# Patient Record
Sex: Female | Born: 1940 | Race: White | Hispanic: No | Smoking: Former smoker
Health system: Southern US, Community
[De-identification: ages and names within clinical notes are randomized; demographics above are authoritative.]

## PROBLEM LIST (undated history)

## (undated) DIAGNOSIS — C801 Malignant (primary) neoplasm, unspecified: Secondary | ICD-10-CM

## (undated) DIAGNOSIS — R519 Headache, unspecified: Secondary | ICD-10-CM

## (undated) DIAGNOSIS — M1990 Unspecified osteoarthritis, unspecified site: Secondary | ICD-10-CM

## (undated) DIAGNOSIS — R51 Headache: Secondary | ICD-10-CM

## (undated) DIAGNOSIS — Z87442 Personal history of urinary calculi: Secondary | ICD-10-CM

## (undated) DIAGNOSIS — M79672 Pain in left foot: Secondary | ICD-10-CM

## (undated) DIAGNOSIS — M79671 Pain in right foot: Secondary | ICD-10-CM

## (undated) HISTORY — PX: CRYOABLATION: SHX1415

## (undated) HISTORY — PX: OTHER SURGICAL HISTORY: SHX169

## (undated) HISTORY — PX: TONSILLECTOMY: SUR1361

## (undated) HISTORY — PX: DILATION AND CURETTAGE OF UTERUS: SHX78

## (undated) HISTORY — PX: JOINT REPLACEMENT: SHX530

---

## 1987-06-06 HISTORY — PX: CHOLECYSTECTOMY: SHX55

## 1989-06-05 DIAGNOSIS — Z87442 Personal history of urinary calculi: Secondary | ICD-10-CM

## 1989-06-05 HISTORY — PX: BREAST SURGERY: SHX581

## 1989-06-05 HISTORY — DX: Personal history of urinary calculi: Z87.442

## 1989-06-05 HISTORY — PX: EXTRACORPOREAL SHOCK WAVE LITHOTRIPSY: SHX1557

## 1997-06-05 DIAGNOSIS — C801 Malignant (primary) neoplasm, unspecified: Secondary | ICD-10-CM

## 1997-06-05 HISTORY — PX: OTHER SURGICAL HISTORY: SHX169

## 1997-06-05 HISTORY — PX: BREAST BIOPSY: SHX20

## 1997-06-05 HISTORY — DX: Malignant (primary) neoplasm, unspecified: C80.1

## 2002-09-30 ENCOUNTER — Ambulatory Visit (HOSPITAL_COMMUNITY): Admission: RE | Admit: 2002-09-30 | Discharge: 2002-09-30 | Payer: Self-pay | Admitting: Internal Medicine

## 2002-09-30 ENCOUNTER — Encounter: Payer: Self-pay | Admitting: Internal Medicine

## 2003-10-13 ENCOUNTER — Ambulatory Visit (HOSPITAL_COMMUNITY): Admission: RE | Admit: 2003-10-13 | Discharge: 2003-10-13 | Payer: Self-pay

## 2004-10-18 ENCOUNTER — Ambulatory Visit (HOSPITAL_COMMUNITY): Admission: RE | Admit: 2004-10-18 | Discharge: 2004-10-18 | Payer: Self-pay | Admitting: Internal Medicine

## 2005-10-24 ENCOUNTER — Ambulatory Visit (HOSPITAL_COMMUNITY): Admission: RE | Admit: 2005-10-24 | Discharge: 2005-10-24 | Payer: Self-pay | Admitting: Internal Medicine

## 2006-10-30 ENCOUNTER — Ambulatory Visit (HOSPITAL_COMMUNITY): Admission: RE | Admit: 2006-10-30 | Discharge: 2006-10-30 | Payer: Self-pay | Admitting: Internal Medicine

## 2006-11-12 ENCOUNTER — Encounter: Admission: RE | Admit: 2006-11-12 | Discharge: 2006-11-12 | Payer: Self-pay | Admitting: Family Medicine

## 2007-11-19 ENCOUNTER — Ambulatory Visit (HOSPITAL_COMMUNITY): Admission: RE | Admit: 2007-11-19 | Discharge: 2007-11-19 | Payer: Self-pay | Admitting: Internal Medicine

## 2008-11-24 ENCOUNTER — Ambulatory Visit (HOSPITAL_COMMUNITY): Admission: RE | Admit: 2008-11-24 | Discharge: 2008-11-24 | Payer: Self-pay | Admitting: Internal Medicine

## 2009-11-30 ENCOUNTER — Ambulatory Visit (HOSPITAL_COMMUNITY): Admission: RE | Admit: 2009-11-30 | Discharge: 2009-11-30 | Payer: Self-pay | Admitting: Internal Medicine

## 2009-12-10 ENCOUNTER — Encounter: Admission: RE | Admit: 2009-12-10 | Discharge: 2009-12-10 | Payer: Self-pay | Admitting: Internal Medicine

## 2010-06-26 ENCOUNTER — Encounter: Payer: Self-pay | Admitting: Family Medicine

## 2010-11-07 ENCOUNTER — Other Ambulatory Visit (HOSPITAL_COMMUNITY): Payer: Self-pay | Admitting: Internal Medicine

## 2010-11-07 DIAGNOSIS — Z1231 Encounter for screening mammogram for malignant neoplasm of breast: Secondary | ICD-10-CM

## 2010-12-08 ENCOUNTER — Ambulatory Visit (HOSPITAL_COMMUNITY)
Admission: RE | Admit: 2010-12-08 | Discharge: 2010-12-08 | Disposition: A | Discharge status: Home / Self Care | Payer: PRIVATE HEALTH INSURANCE | Source: Ambulatory Visit | Attending: Internal Medicine | Admitting: Internal Medicine

## 2010-12-08 DIAGNOSIS — Z1231 Encounter for screening mammogram for malignant neoplasm of breast: Secondary | ICD-10-CM

## 2011-01-04 ENCOUNTER — Other Ambulatory Visit (HOSPITAL_COMMUNITY): Payer: Self-pay | Admitting: Internal Medicine

## 2011-01-04 DIAGNOSIS — Z1231 Encounter for screening mammogram for malignant neoplasm of breast: Secondary | ICD-10-CM

## 2011-12-11 ENCOUNTER — Ambulatory Visit (HOSPITAL_COMMUNITY): Payer: PRIVATE HEALTH INSURANCE

## 2012-01-16 ENCOUNTER — Ambulatory Visit (HOSPITAL_COMMUNITY)
Admission: RE | Admit: 2012-01-16 | Discharge: 2012-01-16 | Disposition: A | Discharge status: Home / Self Care | Payer: PRIVATE HEALTH INSURANCE | Source: Ambulatory Visit | Attending: Internal Medicine | Admitting: Internal Medicine

## 2012-01-16 DIAGNOSIS — Z1231 Encounter for screening mammogram for malignant neoplasm of breast: Secondary | ICD-10-CM

## 2012-12-24 ENCOUNTER — Other Ambulatory Visit (HOSPITAL_COMMUNITY): Payer: Self-pay | Admitting: Family Medicine

## 2012-12-24 DIAGNOSIS — Z1231 Encounter for screening mammogram for malignant neoplasm of breast: Secondary | ICD-10-CM

## 2013-01-21 ENCOUNTER — Ambulatory Visit (HOSPITAL_COMMUNITY)
Admission: RE | Admit: 2013-01-21 | Discharge: 2013-01-21 | Disposition: A | Discharge status: Home / Self Care | Payer: PRIVATE HEALTH INSURANCE | Source: Ambulatory Visit | Attending: Family Medicine | Admitting: Family Medicine

## 2013-01-21 ENCOUNTER — Other Ambulatory Visit (HOSPITAL_COMMUNITY): Payer: Self-pay | Admitting: Family Medicine

## 2013-01-21 DIAGNOSIS — Z1231 Encounter for screening mammogram for malignant neoplasm of breast: Secondary | ICD-10-CM

## 2014-01-20 ENCOUNTER — Other Ambulatory Visit (HOSPITAL_COMMUNITY): Payer: Self-pay | Admitting: Family Medicine

## 2014-01-20 DIAGNOSIS — Z1231 Encounter for screening mammogram for malignant neoplasm of breast: Secondary | ICD-10-CM

## 2014-01-27 ENCOUNTER — Other Ambulatory Visit (HOSPITAL_COMMUNITY): Payer: Self-pay | Admitting: Family Medicine

## 2014-01-27 ENCOUNTER — Ambulatory Visit (HOSPITAL_COMMUNITY)
Admission: RE | Admit: 2014-01-27 | Discharge: 2014-01-27 | Disposition: A | Discharge status: Home / Self Care | Payer: PRIVATE HEALTH INSURANCE | Source: Ambulatory Visit | Attending: Family Medicine | Admitting: Family Medicine

## 2014-01-27 ENCOUNTER — Ambulatory Visit (HOSPITAL_COMMUNITY): Payer: PRIVATE HEALTH INSURANCE

## 2014-01-27 DIAGNOSIS — Z1231 Encounter for screening mammogram for malignant neoplasm of breast: Secondary | ICD-10-CM | POA: Diagnosis present

## 2015-01-14 ENCOUNTER — Other Ambulatory Visit (HOSPITAL_COMMUNITY): Payer: Self-pay | Admitting: Obstetrics and Gynecology

## 2015-01-14 DIAGNOSIS — Z1231 Encounter for screening mammogram for malignant neoplasm of breast: Secondary | ICD-10-CM

## 2015-02-02 ENCOUNTER — Ambulatory Visit (HOSPITAL_COMMUNITY)
Admission: RE | Admit: 2015-02-02 | Discharge: 2015-02-02 | Disposition: A | Discharge status: Home / Self Care | Payer: PRIVATE HEALTH INSURANCE | Source: Ambulatory Visit | Attending: Obstetrics and Gynecology | Admitting: Obstetrics and Gynecology

## 2015-02-02 DIAGNOSIS — Z1231 Encounter for screening mammogram for malignant neoplasm of breast: Secondary | ICD-10-CM | POA: Diagnosis present

## 2016-01-20 ENCOUNTER — Other Ambulatory Visit: Payer: Self-pay | Admitting: Internal Medicine

## 2016-01-20 DIAGNOSIS — Z1231 Encounter for screening mammogram for malignant neoplasm of breast: Secondary | ICD-10-CM

## 2016-02-04 ENCOUNTER — Ambulatory Visit
Admission: RE | Admit: 2016-02-04 | Discharge: 2016-02-04 | Disposition: A | Discharge status: Home / Self Care | Payer: Medicare HMO | Source: Ambulatory Visit | Attending: Internal Medicine | Admitting: Internal Medicine

## 2016-02-04 DIAGNOSIS — Z1231 Encounter for screening mammogram for malignant neoplasm of breast: Secondary | ICD-10-CM

## 2016-07-21 ENCOUNTER — Other Ambulatory Visit (HOSPITAL_COMMUNITY): Payer: Self-pay | Admitting: *Deleted

## 2016-07-21 NOTE — Progress Notes (Signed)
Medical clearnce dr Jeralene Huff on chart for 07-28-16 surgery

## 2016-07-21 NOTE — Patient Instructions (Signed)
Lisa Hood  07/21/2016   Your procedure is scheduled on: 08-01-16  Report to [facility] Main  Entrance take [facility]  elevators to 3rd floor to  [place] at 1120 AM.  Call this number if you have problems the morning of surgery [phone]   Remember: ONLY 1 PERSON MAY GO WITH YOU TO SHORT STAY TO GET  READY MORNING OF [place].  Do not eat food :After Midnight Monday night, clear liquids from midnight Monday night until 820 am day of surgery, then nothing by mouth after 820 am day of surgery.     Take these medicines the morning of surgery with A SIP OF WATER: none              You may not have any metal on your body including hair pins and              piercings  Do not wear jewelry, make-up, lotions, powders or perfumes, deodorant             Do not wear nail polish.  Do not shave  48 hours prior to surgery.              Men may shave face and neck.   Do not bring valuables to the hospital. [place].  Contacts, dentures or bridgework may not be worn into surgery.  Leave suitcase in the car. After surgery it may be brought to your room.                   Please read over the following fact sheets you were given: _____________________________________________________________________                CLEAR LIQUID DIET   Foods Allowed                                                                     Foods Excluded  Coffee and tea, regular and decaf                             liquids that you cannot  Plain Jell-O in any flavor                                             see through such as: Fruit ices (not with fruit pulp)                                     milk, soups, orange juice  Iced Popsicles                                    All solid food Carbonated beverages, regular and diet  Cranberry, grape and apple juices Sports drinks like  Gatorade Lightly seasoned clear broth or consume(fat free) Sugar, honey syrup  Sample Menu Breakfast                                Lunch                                     Supper Cranberry juice                    Beef broth                            Chicken broth Jell-O                                     Grape juice                           Apple juice Coffee or tea                        Jell-O                                      Popsicle                                                Coffee or tea                        Coffee or tea  _____________________________________________________________________  [facility] - Preparing for Surgery Before surgery, you can play an important role.  Because skin is not sterile, your skin needs to be as free of germs as possible.  You can reduce the number of germs on your skin by washing with CHG (chlorahexidine gluconate) soap before surgery.  CHG is an antiseptic cleaner which kills germs and bonds with the skin to continue killing germs even after washing. Please DO NOT use if you have an allergy to CHG or antibacterial soaps.  If your skin becomes reddened/irritated stop using the CHG and inform your nurse when you arrive at Short Stay. Do not shave (including legs and underarms) for at least 48 hours prior to the first CHG shower.  You may shave your face/neck. Please follow these instructions carefully:  1.  Shower with CHG Soap the night before surgery and the  morning of Surgery.  2.  If you choose to wash your hair, wash your hair first as usual with your  normal  shampoo.  3.  After you shampoo, rinse your hair and body thoroughly to remove the  shampoo.                           4.  Use CHG as you would any other liquid soap.  You can apply chg directly  to the skin and wash  Gently with a scrungie or clean washcloth.  5.  Apply the CHG Soap to your body ONLY FROM THE NECK DOWN.   Do not use on face/ open                            Wound or open sores. Avoid contact with eyes, ears mouth and genitals (private parts).                       Wash face,  Genitals (private parts) with your normal soap.             6.  Wash thoroughly, paying special attention to the area where your surgery  will be performed.  7.  Thoroughly rinse your body with warm water from the neck down.  8.  DO NOT shower/wash with your normal soap after using and rinsing off  the CHG Soap.                9.  Pat yourself dry with a clean towel.            10.  Wear clean pajamas.            11.  Place clean sheets on your bed the night of your first shower and do not  sleep with pets. Day of Surgery : Do not apply any lotions/deodorants the morning of surgery.  Please wear clean clothes to the hospital/surgery center.  FAILURE TO FOLLOW THESE INSTRUCTIONS MAY RESULT IN THE CANCELLATION OF YOUR SURGERY PATIENT SIGNATURE_________________________________  NURSE SIGNATURE__________________________________  ________________________________________________________________________   Adam Phenix  An incentive spirometer is a tool that can help keep your lungs clear and active. This tool measures how well you are filling your lungs with each breath. Taking long deep breaths may help reverse or decrease the chance of developing breathing (pulmonary) problems (especially infection) following:  A long period of time when you are unable to move or be active. BEFORE THE PROCEDURE   If the spirometer includes an indicator to show your best effort, your nurse or respiratory therapist will set it to a desired goal.  If possible, sit up straight or lean slightly forward. Try not to slouch.  Hold the incentive spirometer in an upright position. INSTRUCTIONS FOR USE  1. Sit on the edge of your bed if possible, or sit up as far as you can in bed or on a chair. 2. Hold the incentive spirometer in an upright position. 3. Breathe out  normally. 4. Place the mouthpiece in your mouth and seal your lips tightly around it. 5. Breathe in slowly and as deeply as possible, raising the piston or the ball toward the top of the column. 6. Hold your breath for 3-5 seconds or for as long as possible. Allow the piston or ball to fall to the bottom of the column. 7. Remove the mouthpiece from your mouth and breathe out normally. 8. Rest for a few seconds and repeat Steps 1 through 7 at least 10 times every 1-2 hours when you are awake. Take your time and take a few normal breaths between deep breaths. 9. The spirometer may include an indicator to show your best effort. Use the indicator as a goal to work toward during each repetition. 10. After each set of 10 deep breaths, practice coughing to be sure your lungs are clear. If you have an incision (the cut made at the time of  surgery), support your incision when coughing by placing a pillow or rolled up towels firmly against it. Once you are able to get out of bed, walk around indoors and cough well. You may stop using the incentive spirometer when instructed by your caregiver.  RISKS AND COMPLICATIONS  Take your time so you do not get dizzy or light-headed.  If you are in pain, you may need to take or ask for pain medication before doing incentive spirometry. It is harder to take a deep breath if you are having pain. AFTER USE  Rest and breathe slowly and easily.  It can be helpful to keep track of a log of your progress. Your caregiver can provide you with a simple table to help with this. If you are using the spirometer at home, follow these instructions: [place] IF:   You are having difficultly using the spirometer.  You have trouble using the spirometer as often as instructed.  Your pain medication is not giving enough relief while using the spirometer.  You develop fever of 100.5 F (38.1 C) or higher. SEEK IMMEDIATE MEDICAL CARE IF:   You cough up bloody sputum  that had not been present before.  You develop fever of 102 F (38.9 C) or greater.  You develop worsening pain at or near the incision site. MAKE SURE YOU:   Understand these instructions.  Will watch your condition.  Will get help right away if you are not doing well or get worse. Document Released: 10/02/2006 Document Revised: 08/14/2011 Document Reviewed: 12/03/2006 Exit[employer] Patient Information 2014 [employer], [place].   ________________________________________________________________________  WHAT IS A BLOOD TRANSFUSION? Blood Transfusion Information  A transfusion is the replacement of blood or some of its parts. Blood is made up of multiple cells which provide different functions.  Red blood cells carry oxygen and are used for blood loss replacement.  White blood cells fight against infection.  Platelets control bleeding.  Plasma helps clot blood.  Other blood products are available for specialized needs, such as hemophilia or other clotting disorders. BEFORE THE TRANSFUSION  Who gives blood for transfusions?   Healthy volunteers who are fully evaluated to make sure their blood is safe. This is blood bank blood. Transfusion therapy is the safest it has ever been in the practice of medicine. Before blood is taken from a donor, a complete history is taken to make sure that person has no history of diseases nor engages in risky social behavior (examples are intravenous drug use or sexual activity with multiple partners). The donor's travel history is screened to minimize risk of transmitting infections, such as malaria. The donated blood is tested for signs of infectious diseases, such as HIV and hepatitis. The blood is then tested to be sure it is compatible with you in order to minimize the chance of a transfusion reaction. If you or a relative donates blood, this is often done in anticipation of surgery and is not appropriate for emergency situations. It takes many days to  process the donated blood. RISKS AND COMPLICATIONS Although transfusion therapy is very safe and saves many lives, the main dangers of transfusion include:   Getting an infectious disease.  Developing a transfusion reaction. This is an allergic reaction to something in the blood you were given. Every precaution is taken to prevent this. The decision to have a blood transfusion has been considered carefully by your caregiver before blood is given. Blood is not given unless the benefits outweigh the risks. AFTER THE  TRANSFUSION  Right after receiving a blood transfusion, you will usually feel much better and more energetic. This is especially true if your red blood cells have gotten low (anemic). The transfusion raises the level of the red blood cells which carry oxygen, and this usually causes an energy increase.  The nurse administering the transfusion will monitor you carefully for complications. HOME CARE INSTRUCTIONS  No special instructions are needed after a transfusion. You may find your energy is better. Speak with your caregiver about any limitations on activity for underlying diseases you may have. SEEK MEDICAL CARE IF:   Your condition is not improving after your transfusion.  You develop redness or irritation at the intravenous (IV) site. SEEK IMMEDIATE MEDICAL CARE IF:  Any of the following symptoms occur over the next 12 hours:  Shaking chills.  You have a temperature by mouth above 102 F (38.9 C), not controlled by medicine.  Chest, back, or muscle pain.  People around you feel you are not acting correctly or are confused.  Shortness of breath or difficulty breathing.  Dizziness and fainting.  You get a rash or develop hives.  You have a decrease in urine output.  Your urine turns a dark color or changes to pink, red, or brown. Any of the following symptoms occur over the next 10 days:  You have a temperature by mouth above 102 F (38.9 C), not controlled by  medicine.  Shortness of breath.  Weakness after normal activity.  The white part of the eye turns yellow (jaundice).  You have a decrease in the amount of urine or are urinating less often.  Your urine turns a dark color or changes to pink, red, or brown. Document Released: 05/19/2000 Document Revised: 08/14/2011 Document Reviewed: 01/06/2008 [facility] Patient Information 2014 [place], [place].  _______________________________________________________________________

## 2016-07-25 ENCOUNTER — Encounter (HOSPITAL_COMMUNITY): Payer: Self-pay

## 2016-07-25 ENCOUNTER — Encounter (HOSPITAL_COMMUNITY)
Admission: RE | Admit: 2016-07-25 | Discharge: 2016-07-25 | Disposition: A | Discharge status: Home / Self Care | Payer: Medicare HMO | Source: Ambulatory Visit | Attending: Orthopedic Surgery | Admitting: Orthopedic Surgery

## 2016-07-25 ENCOUNTER — Encounter (INDEPENDENT_AMBULATORY_CARE_PROVIDER_SITE_OTHER): Payer: Self-pay

## 2016-07-25 DIAGNOSIS — M1612 Unilateral primary osteoarthritis, left hip: Secondary | ICD-10-CM | POA: Diagnosis not present

## 2016-07-25 DIAGNOSIS — Z01812 Encounter for preprocedural laboratory examination: Secondary | ICD-10-CM | POA: Insufficient documentation

## 2016-07-25 HISTORY — DX: Headache: R51

## 2016-07-25 HISTORY — DX: Unspecified osteoarthritis, unspecified site: M19.90

## 2016-07-25 HISTORY — DX: Headache, unspecified: R51.9

## 2016-07-25 HISTORY — DX: Personal history of urinary calculi: Z87.442

## 2016-07-25 HISTORY — DX: Pain in right foot: M79.671

## 2016-07-25 HISTORY — DX: Pain in left foot: M79.672

## 2016-07-25 HISTORY — DX: Malignant (primary) neoplasm, unspecified: C80.1

## 2016-07-25 LAB — CBC
HCT: 40.5 % (ref 36.0–46.0)
HEMOGLOBIN: 13.8 g/dL (ref 12.0–15.0)
MCH: 32.2 pg (ref 26.0–34.0)
MCHC: 34.1 g/dL (ref 30.0–36.0)
MCV: 94.4 fL (ref 78.0–100.0)
PLATELETS: 289 10*3/uL (ref 150–400)
RBC: 4.29 MIL/uL (ref 3.87–5.11)
RDW: 13.6 % (ref 11.5–15.5)
WBC: 8.3 10*3/uL (ref 4.0–10.5)

## 2016-07-25 LAB — SURGICAL PCR SCREEN
MRSA, PCR: NEGATIVE
Staphylococcus aureus: NEGATIVE

## 2016-07-25 LAB — ABO/RH: ABO/RH(D): A POS

## 2016-07-25 NOTE — Progress Notes (Signed)
EKG 2-113-18 DR [facility] ON CHART

## 2016-07-27 NOTE — H&P (Signed)
TOTAL HIP ADMISSION H&P  Patient is admitted for left total hip arthroplasty, anterior approach.  Subjective:  Chief Complaint:    Left hip primary OA / pain  HPI: Lisa Hood, 76 y.o. female, has a history of pain and functional disability in the left hip(s) due to arthritis and patient has failed non-surgical conservative treatments for greater than 12 weeks to include NSAID's and/or analgesics, use of assistive devices and activity modification.  Onset of symptoms was gradual starting 2+ years ago with gradually worsening course since that time.The patient noted no past surgery on the left hip(s).  Patient currently rates pain in the left hip at 9 out of 10 with activity. Patient has night pain, worsening of pain with activity and weight bearing, trendelenberg gait, pain that interfers with activities of daily living and pain with passive range of motion. Patient has evidence of periarticular osteophytes and joint space narrowing by imaging studies. This condition presents safety issues increasing the risk of falls.  There is no current active infection.  Risks, benefits and expectations were discussed with the patient.  Risks including but not limited to the risk of anesthesia, blood clots, nerve damage, blood vessel damage, failure of the prosthesis, infection and up to and including death.  Patient understand the risks, benefits and expectations and wishes to proceed with surgery.   PCP: Wonda Cerise, MD  D/C Plans:       Home - No PT  Post-op Meds:       No Rx given   Tranexamic Acid:      To be given - IV   Decadron:      Is to be given  FYI:     ASA  [place]  PT: No PT  DME: Rx given for RW and 3-n-1     Past Medical History:  Diagnosis Date  . Arthritis    oa  . Cancer [facility]) 1999   right breast  . Headache    hx of vascular 40 yrs ago  . History of kidney stones 1991  . Pain in both feet    at night only toes are in a tight shoe occ pain in middle of a foot at  times    Past Surgical History:  Procedure Laterality Date  . BREAST BIOPSY Bilateral 1999  . BREAST SURGERY  1991   right mastectomy with lymph node removal  . breat lumpectomy Left 1999  . CHOLECYSTECTOMY  1989  . CRYOABLATION     of cervix  . DILATION AND CURETTAGE OF UTERUS  1975 and 1990's   x 2  . EXTRACORPOREAL SHOCK WAVE LITHOTRIPSY  1991  . pac insertion and removal    . TONSILLECTOMY  age 44    No prescriptions prior to admission.   No Known Allergies   Social History  Substance Use Topics  . Smoking status: Former Smoker    Packs/day: 1.00    Years: 30.00    Types: Cigarettes    Quit date: 03/05/1998  . Smokeless tobacco: Never Used  . Alcohol use Yes     Comment: occ       Review of Systems  Constitutional: Negative.   HENT: Negative.   Eyes: Negative.   Respiratory: Negative.   Cardiovascular: Negative.   Gastrointestinal: Negative.   Genitourinary: Positive for frequency.  Musculoskeletal: Positive for joint pain.  Skin: Negative.   Neurological: Positive for headaches.  Endo/Heme/Allergies: Negative.   Psychiatric/Behavioral: Negative.     Objective:  Physical Exam  Constitutional: She is oriented to person, place, and time. She appears well-developed.  HENT:  Head: Normocephalic.  Eyes: Pupils are equal, round, and reactive to light.  Neck: Neck supple. No JVD present. No tracheal deviation present. No thyromegaly present.  Cardiovascular: Normal rate, regular rhythm, normal heart sounds and intact distal pulses.   Respiratory: Effort normal and breath sounds normal. No respiratory distress. She has no wheezes.  GI: Soft. There is no tenderness. There is no guarding.  Musculoskeletal:       Left hip: She exhibits decreased range of motion, decreased strength, tenderness and bony tenderness. She exhibits no swelling, no deformity and no laceration.  Lymphadenopathy:    She has no cervical adenopathy.  Neurological: She is alert and oriented  to person, place, and time.  Skin: Skin is warm and dry.  Psychiatric: She has a normal mood and affect.      Labs:  Estimated body mass index is 21.61 kg/m as calculated from the following:   Height as of 07/25/16: 5\' 3"  (1.6 m).   Weight as of 07/25/16: 55.3 kg (122 lb).   Imaging Review Plain radiographs demonstrate severe degenerative joint disease of the left hip(s). The bone quality appears to be good for age and reported activity level.  Assessment/Plan:  End stage arthritis, left hip(s)  The patient history, physical examination, clinical judgement of the provider and imaging studies are consistent with end stage degenerative joint disease of the left hip(s) and total hip arthroplasty is deemed medically necessary. The treatment options including medical management, injection therapy, arthroscopy and arthroplasty were discussed at length. The risks and benefits of total hip arthroplasty were presented and reviewed. The risks due to aseptic loosening, infection, stiffness, dislocation/subluxation,  thromboembolic complications and other imponderables were discussed.  The patient acknowledged the explanation, agreed to proceed with the plan and consent was signed. Patient is being admitted for inpatient treatment for surgery, pain control, PT, OT, prophylactic antibiotics, VTE prophylaxis, progressive ambulation and ADL's and discharge planning.The patient is planning to be discharged home.      [place] Pugh Jazyah Butsch   PA-C  07/27/2016, 11:20 AM

## 2016-08-01 ENCOUNTER — Inpatient Hospital Stay (HOSPITAL_COMMUNITY): Payer: Medicare HMO

## 2016-08-01 ENCOUNTER — Inpatient Hospital Stay (HOSPITAL_COMMUNITY)
Admission: RE | Admit: 2016-08-01 | Discharge: 2016-08-03 | DRG: 470 | Disposition: A | Discharge status: Home / Self Care | Payer: Medicare HMO | Source: Ambulatory Visit | Attending: Orthopedic Surgery | Admitting: Orthopedic Surgery

## 2016-08-01 ENCOUNTER — Encounter (HOSPITAL_COMMUNITY): Payer: Self-pay | Admitting: *Deleted

## 2016-08-01 ENCOUNTER — Inpatient Hospital Stay (HOSPITAL_COMMUNITY): Payer: Medicare HMO | Admitting: Anesthesiology

## 2016-08-01 ENCOUNTER — Encounter (HOSPITAL_COMMUNITY)
Admission: RE | Disposition: A | Discharge status: Home / Self Care | Payer: Self-pay | Source: Ambulatory Visit | Attending: Orthopedic Surgery

## 2016-08-01 DIAGNOSIS — Q6589 Other specified congenital deformities of hip: Secondary | ICD-10-CM | POA: Diagnosis not present

## 2016-08-01 DIAGNOSIS — Z96642 Presence of left artificial hip joint: Secondary | ICD-10-CM

## 2016-08-01 DIAGNOSIS — Z87442 Personal history of urinary calculi: Secondary | ICD-10-CM

## 2016-08-01 DIAGNOSIS — M1612 Unilateral primary osteoarthritis, left hip: Principal | ICD-10-CM | POA: Diagnosis present

## 2016-08-01 DIAGNOSIS — Z96649 Presence of unspecified artificial hip joint: Secondary | ICD-10-CM

## 2016-08-01 DIAGNOSIS — Z87891 Personal history of nicotine dependence: Secondary | ICD-10-CM | POA: Diagnosis not present

## 2016-08-01 DIAGNOSIS — Z9049 Acquired absence of other specified parts of digestive tract: Secondary | ICD-10-CM

## 2016-08-01 DIAGNOSIS — M25552 Pain in left hip: Secondary | ICD-10-CM

## 2016-08-01 HISTORY — PX: TOTAL HIP ARTHROPLASTY: SHX124

## 2016-08-01 LAB — TYPE AND SCREEN
ABO/RH(D): A POS
ANTIBODY SCREEN: NEGATIVE

## 2016-08-01 SURGERY — ARTHROPLASTY, HIP, TOTAL, ANTERIOR APPROACH
Anesthesia: Spinal | Site: Hip | Laterality: Left

## 2016-08-01 MED ORDER — CEFAZOLIN SODIUM-DEXTROSE 2-4 GM/100ML-% IV SOLN
2.0000 g | Freq: Four times a day (QID) | INTRAVENOUS | Status: AC
  Administered 2016-08-01 – 2016-08-02 (×2): 2 g via INTRAVENOUS
  Filled 2016-08-01 (×2): qty 100

## 2016-08-01 MED ORDER — FENTANYL CITRATE (PF) 100 MCG/2ML IJ SOLN: 25.0000 ug | INTRAMUSCULAR | Status: DC | PRN

## 2016-08-01 MED ORDER — CELECOXIB 200 MG PO CAPS
200.0000 mg | ORAL_CAPSULE | Freq: Two times a day (BID) | ORAL | Status: DC
  Administered 2016-08-01 – 2016-08-03 (×4): 200 mg via ORAL
  Filled 2016-08-01 (×4): qty 1

## 2016-08-01 MED ORDER — POLYETHYLENE GLYCOL 3350 17 G PO PACK: 17.0000 g | PACK | Freq: Two times a day (BID) | ORAL | 0 refills | Status: AC

## 2016-08-01 MED ORDER — PROPOFOL 500 MG/50ML IV EMUL
INTRAVENOUS | Status: DC | PRN
  Administered 2016-08-01: 50 ug/kg/min via INTRAVENOUS

## 2016-08-01 MED ORDER — LIDOCAINE 2% (20 MG/ML) 5 ML SYRINGE
INTRAMUSCULAR | Status: AC
  Filled 2016-08-01: qty 5

## 2016-08-01 MED ORDER — FERROUS SULFATE 325 (65 FE) MG PO TABS: 325.0000 mg | ORAL_TABLET | Freq: Three times a day (TID) | ORAL | Status: AC

## 2016-08-01 MED ORDER — POLYETHYLENE GLYCOL 3350 17 G PO PACK
17.0000 g | PACK | Freq: Two times a day (BID) | ORAL | Status: DC
  Administered 2016-08-02: 09:00:00 17 g via ORAL
  Filled 2016-08-01 (×3): qty 1

## 2016-08-01 MED ORDER — BUPIVACAINE IN DEXTROSE 0.75-8.25 % IT SOLN
INTRATHECAL | Status: DC | PRN
  Administered 2016-08-01: 1.6 mL via INTRATHECAL

## 2016-08-01 MED ORDER — ASPIRIN 81 MG PO CHEW
81.0000 mg | CHEWABLE_TABLET | Freq: Two times a day (BID) | ORAL | Status: DC
  Administered 2016-08-01 – 2016-08-03 (×4): 81 mg via ORAL
  Filled 2016-08-01 (×4): qty 1

## 2016-08-01 MED ORDER — METHOCARBAMOL 1000 MG/10ML IJ SOLN
500.0000 mg | Freq: Four times a day (QID) | INTRAVENOUS | Status: DC | PRN
  Administered 2016-08-01: 17:00:00 500 mg via INTRAVENOUS
  Filled 2016-08-01: qty 550
  Filled 2016-08-01: qty 5

## 2016-08-01 MED ORDER — MENTHOL 3 MG MT LOZG: 1.0000 | LOZENGE | OROMUCOSAL | Status: DC | PRN

## 2016-08-01 MED ORDER — TRANEXAMIC ACID 1000 MG/10ML IV SOLN
1000.0000 mg | INTRAVENOUS | Status: AC
  Administered 2016-08-01: 1000 mg via INTRAVENOUS
  Filled 2016-08-01: qty 1100

## 2016-08-01 MED ORDER — METOCLOPRAMIDE HCL 5 MG PO TABS: 5.0000 mg | ORAL_TABLET | Freq: Three times a day (TID) | ORAL | Status: DC | PRN

## 2016-08-01 MED ORDER — MAGNESIUM CITRATE PO SOLN: 1.0000 | Freq: Once | ORAL | Status: DC | PRN

## 2016-08-01 MED ORDER — ONDANSETRON HCL 4 MG PO TABS
4.0000 mg | ORAL_TABLET | Freq: Four times a day (QID) | ORAL | Status: DC | PRN
  Filled 2016-08-01: qty 1

## 2016-08-01 MED ORDER — HYDROMORPHONE HCL 1 MG/ML IJ SOLN
0.5000 mg | INTRAMUSCULAR | Status: DC | PRN
  Administered 2016-08-01 (×2): 0.5 mg via INTRAVENOUS
  Filled 2016-08-01 (×2): qty 0.5

## 2016-08-01 MED ORDER — PHENYLEPHRINE 40 MCG/ML (10ML) SYRINGE FOR IV PUSH (FOR BLOOD PRESSURE SUPPORT)
PREFILLED_SYRINGE | INTRAVENOUS | Status: AC
  Filled 2016-08-01: qty 10

## 2016-08-01 MED ORDER — MEPERIDINE HCL 50 MG/ML IJ SOLN: 6.2500 mg | INTRAMUSCULAR | Status: DC | PRN

## 2016-08-01 MED ORDER — CEFAZOLIN SODIUM-DEXTROSE 2-4 GM/100ML-% IV SOLN
2.0000 g | INTRAVENOUS | Status: AC
  Administered 2016-08-01: 2 g via INTRAVENOUS
  Filled 2016-08-01: qty 100

## 2016-08-01 MED ORDER — SODIUM CHLORIDE 0.9 % IV SOLN
100.0000 mL/h | INTRAVENOUS | Status: DC
  Administered 2016-08-01: 19:00:00 100 mL/h via INTRAVENOUS
  Filled 2016-08-01 (×6): qty 1000

## 2016-08-01 MED ORDER — ONDANSETRON HCL 4 MG/2ML IJ SOLN
INTRAMUSCULAR | Status: AC
  Filled 2016-08-01: qty 2

## 2016-08-01 MED ORDER — DOCUSATE SODIUM 100 MG PO CAPS
100.0000 mg | ORAL_CAPSULE | Freq: Two times a day (BID) | ORAL | 0 refills | Status: AC
Start: 2016-08-01 — End: ?

## 2016-08-01 MED ORDER — STERILE WATER FOR IRRIGATION IR SOLN
Status: DC | PRN
  Administered 2016-08-01: 1000 mL

## 2016-08-01 MED ORDER — DOCUSATE SODIUM 100 MG PO CAPS
100.0000 mg | ORAL_CAPSULE | Freq: Two times a day (BID) | ORAL | Status: DC
  Administered 2016-08-01 – 2016-08-03 (×4): 100 mg via ORAL
  Filled 2016-08-01 (×4): qty 1

## 2016-08-01 MED ORDER — ASPIRIN 81 MG PO CHEW: 81.0000 mg | CHEWABLE_TABLET | Freq: Two times a day (BID) | ORAL | 0 refills | Status: AC

## 2016-08-01 MED ORDER — PHENYLEPHRINE HCL 10 MG/ML IJ SOLN
INTRAMUSCULAR | Status: DC | PRN
  Administered 2016-08-01 (×3): 40 ug via INTRAVENOUS
  Administered 2016-08-01 (×2): 80 ug via INTRAVENOUS
  Administered 2016-08-01: 40 ug via INTRAVENOUS
  Administered 2016-08-01: 80 ug via INTRAVENOUS

## 2016-08-01 MED ORDER — HYDROCODONE-ACETAMINOPHEN 7.5-325 MG PO TABS: 1.0000 | ORAL_TABLET | ORAL | 0 refills | Status: AC | PRN

## 2016-08-01 MED ORDER — DEXAMETHASONE SODIUM PHOSPHATE 10 MG/ML IJ SOLN
10.0000 mg | Freq: Once | INTRAMUSCULAR | Status: AC
  Administered 2016-08-02: 10 mg via INTRAVENOUS
  Filled 2016-08-01: qty 1

## 2016-08-01 MED ORDER — PROPOFOL 10 MG/ML IV BOLUS
INTRAVENOUS | Status: AC
  Filled 2016-08-01: qty 40

## 2016-08-01 MED ORDER — DIPHENHYDRAMINE HCL 25 MG PO CAPS
25.0000 mg | ORAL_CAPSULE | Freq: Four times a day (QID) | ORAL | Status: DC | PRN
  Administered 2016-08-02: 25 mg via ORAL
  Filled 2016-08-01: qty 1

## 2016-08-01 MED ORDER — HYDROCODONE-ACETAMINOPHEN 7.5-325 MG PO TABS
1.0000 | ORAL_TABLET | ORAL | Status: DC
  Administered 2016-08-01 – 2016-08-03 (×10): 2 via ORAL
  Filled 2016-08-01 (×10): qty 2

## 2016-08-01 MED ORDER — METOCLOPRAMIDE HCL 5 MG/ML IJ SOLN: 5.0000 mg | Freq: Three times a day (TID) | INTRAMUSCULAR | Status: DC | PRN

## 2016-08-01 MED ORDER — FERROUS SULFATE 325 (65 FE) MG PO TABS
325.0000 mg | ORAL_TABLET | Freq: Three times a day (TID) | ORAL | Status: DC
  Administered 2016-08-02 (×2): 325 mg via ORAL
  Filled 2016-08-01 (×2): qty 1

## 2016-08-01 MED ORDER — CHLORHEXIDINE GLUCONATE 4 % EX LIQD
60.0000 mL | Freq: Once | CUTANEOUS | Status: AC
  Administered 2016-08-01: 4 via TOPICAL

## 2016-08-01 MED ORDER — METHOCARBAMOL 500 MG PO TABS: 500.0000 mg | ORAL_TABLET | Freq: Four times a day (QID) | ORAL | Status: DC | PRN

## 2016-08-01 MED ORDER — MIDAZOLAM HCL 2 MG/2ML IJ SOLN
INTRAMUSCULAR | Status: DC | PRN
  Administered 2016-08-01 (×2): 1 mg via INTRAVENOUS

## 2016-08-01 MED ORDER — LACTATED RINGERS IV SOLN
INTRAVENOUS | Status: DC
  Administered 2016-08-01 (×3): via INTRAVENOUS

## 2016-08-01 MED ORDER — ALUM & MAG HYDROXIDE-SIMETH 200-200-20 MG/5ML PO SUSP
30.0000 mL | ORAL | Status: DC | PRN
  Administered 2016-08-02: 30 mL via ORAL
  Filled 2016-08-01: qty 30

## 2016-08-01 MED ORDER — METOCLOPRAMIDE HCL 5 MG/ML IJ SOLN: 10.0000 mg | Freq: Once | INTRAMUSCULAR | Status: DC | PRN

## 2016-08-01 MED ORDER — PROPOFOL 10 MG/ML IV BOLUS
INTRAVENOUS | Status: DC | PRN
  Administered 2016-08-01: 20 mg via INTRAVENOUS

## 2016-08-01 MED ORDER — SODIUM CHLORIDE 0.9 % IR SOLN
Status: DC | PRN
  Administered 2016-08-01: 1000 mL

## 2016-08-01 MED ORDER — ONDANSETRON HCL 4 MG/2ML IJ SOLN
4.0000 mg | Freq: Four times a day (QID) | INTRAMUSCULAR | Status: DC | PRN
  Administered 2016-08-02: 14:00:00 4 mg via INTRAVENOUS
  Filled 2016-08-01: qty 2

## 2016-08-01 MED ORDER — PHENOL 1.4 % MT LIQD
1.0000 | OROMUCOSAL | Status: DC | PRN
  Administered 2016-08-02: 17:00:00 1 via OROMUCOSAL
  Filled 2016-08-01 (×2): qty 177

## 2016-08-01 MED ORDER — LIDOCAINE 2% (20 MG/ML) 5 ML SYRINGE
INTRAMUSCULAR | Status: DC | PRN
  Administered 2016-08-01: 20 mg via INTRAVENOUS

## 2016-08-01 MED ORDER — DEXAMETHASONE SODIUM PHOSPHATE 10 MG/ML IJ SOLN
INTRAMUSCULAR | Status: AC
  Filled 2016-08-01: qty 1

## 2016-08-01 MED ORDER — METHOCARBAMOL 500 MG PO TABS
500.0000 mg | ORAL_TABLET | Freq: Four times a day (QID) | ORAL | 0 refills | Status: AC | PRN
Start: 2016-08-01 — End: ?

## 2016-08-01 MED ORDER — BISACODYL 10 MG RE SUPP: 10.0000 mg | Freq: Every day | RECTAL | Status: DC | PRN

## 2016-08-01 MED ORDER — DEXAMETHASONE SODIUM PHOSPHATE 10 MG/ML IJ SOLN
INTRAMUSCULAR | Status: DC | PRN
  Administered 2016-08-01: 10 mg via INTRAVENOUS

## 2016-08-01 MED ORDER — MIDAZOLAM HCL 2 MG/2ML IJ SOLN
INTRAMUSCULAR | Status: AC
  Filled 2016-08-01: qty 2

## 2016-08-01 MED ORDER — ONDANSETRON HCL 4 MG/2ML IJ SOLN
INTRAMUSCULAR | Status: DC | PRN
  Administered 2016-08-01: 4 mg via INTRAVENOUS

## 2016-08-01 SURGICAL SUPPLY — 38 items
ADH SKN CLS APL DERMABOND .7 (GAUZE/BANDAGES/DRESSINGS) ×1
BAG DECANTER FOR FLEXI CONT (MISCELLANEOUS) IMPLANT
BAG SPEC THK2 15X12 ZIP CLS (MISCELLANEOUS)
BAG ZIPLOCK 12X15 (MISCELLANEOUS) IMPLANT
BLADE SAG 18X100X1.27 (BLADE) ×3 IMPLANT
CAPT HIP TOTAL 2 ×2 IMPLANT
CLOTH BEACON ORANGE TIMEOUT ST (SAFETY) ×3 IMPLANT
COVER PERINEAL POST (MISCELLANEOUS) ×3 IMPLANT
DERMABOND ADVANCED (GAUZE/BANDAGES/DRESSINGS) ×2
DERMABOND ADVANCED .7 DNX12 (GAUZE/BANDAGES/DRESSINGS) ×1 IMPLANT
DRAPE STERI IOBAN 125X83 (DRAPES) ×3 IMPLANT
DRAPE U-SHAPE 47X51 STRL (DRAPES) ×6 IMPLANT
DRESSING AQUACEL AG SP 3.5X10 (GAUZE/BANDAGES/DRESSINGS) ×1 IMPLANT
DRSG AQUACEL AG SP 3.5X10 (GAUZE/BANDAGES/DRESSINGS) ×3
DURAPREP 26ML APPLICATOR (WOUND CARE) ×3 IMPLANT
ELECT REM PT RETURN 15FT ADLT (MISCELLANEOUS) IMPLANT
ELECT REM PT RETURN 9FT ADLT (ELECTROSURGICAL) ×3
ELECTRODE REM PT RTRN 9FT ADLT (ELECTROSURGICAL) ×1 IMPLANT
GLOVE BIOGEL M STRL SZ7.5 (GLOVE) ×2 IMPLANT
GLOVE BIOGEL PI IND STRL 7.5 (GLOVE) ×1 IMPLANT
GLOVE BIOGEL PI IND STRL 8.5 (GLOVE) ×1 IMPLANT
GLOVE BIOGEL PI INDICATOR 7.5 (GLOVE) ×4
GLOVE BIOGEL PI INDICATOR 8.5 (GLOVE)
GLOVE ECLIPSE 8.0 STRL XLNG CF (GLOVE) ×2 IMPLANT
GLOVE ORTHO TXT STRL SZ7.5 (GLOVE) ×3 IMPLANT
GOWN STRL REUS W/TWL LRG LVL3 (GOWN DISPOSABLE) ×3 IMPLANT
GOWN STRL REUS W/TWL XL LVL3 (GOWN DISPOSABLE) ×3 IMPLANT
HOLDER FOLEY CATH W/STRAP (MISCELLANEOUS) ×3 IMPLANT
PACK ANTERIOR HIP CUSTOM (KITS) ×3 IMPLANT
SUT MNCRL AB 4-0 PS2 18 (SUTURE) ×3 IMPLANT
SUT VIC AB 1 CT1 36 (SUTURE) ×9 IMPLANT
SUT VIC AB 2-0 CT1 27 (SUTURE) ×6
SUT VIC AB 2-0 CT1 TAPERPNT 27 (SUTURE) ×2 IMPLANT
SUT VLOC 180 0 24IN GS25 (SUTURE) ×3 IMPLANT
TRAY FOLEY CATH 14FRSI W/METER (CATHETERS) ×2 IMPLANT
TRAY FOLEY W/METER SILVER 16FR (SET/KITS/TRAYS/PACK) IMPLANT
WATER STERILE IRR 1500ML POUR (IV SOLUTION) ×3 IMPLANT
YANKAUER SUCT BULB TIP 10FT TU (MISCELLANEOUS) ×2 IMPLANT

## 2016-08-01 NOTE — Anesthesia Preprocedure Evaluation (Signed)
Anesthesia Evaluation  Patient identified by MRN, date of birth, ID band Patient awake    Reviewed: Allergy & Precautions, NPO status , Patient's Chart, lab work & pertinent test results  Airway Mallampati: II  TM Distance: >3 FB Neck ROM: Full    Dental no notable dental hx.    Pulmonary former smoker,    Pulmonary exam normal breath sounds clear to auscultation       Cardiovascular negative cardio ROS Normal cardiovascular exam Rhythm:Regular Rate:Normal     Neuro/Psych negative neurological ROS  negative psych ROS   GI/Hepatic negative GI ROS, Neg liver ROS,   Endo/Other  negative endocrine ROS  Renal/GU negative Renal ROS  negative genitourinary   Musculoskeletal negative musculoskeletal ROS (+)   Abdominal   Peds negative pediatric ROS (+)  Hematology negative hematology ROS (+)   Anesthesia Other Findings   Reproductive/Obstetrics negative OB ROS                             Anesthesia Physical Anesthesia Plan  ASA: II  Anesthesia Plan: Spinal   Post-op Pain Management:    Induction:   Airway Management Planned: Simple Face Mask  Additional Equipment:   Intra-op Plan:   Post-operative Plan:   Informed Consent: I have reviewed the patients History and Physical, chart, labs and discussed the procedure including the risks, benefits and alternatives for the proposed anesthesia with the patient or authorized representative who has indicated his/her understanding and acceptance.   Dental advisory given  Plan Discussed with: CRNA  Anesthesia Plan Comments:         Anesthesia Quick Evaluation

## 2016-08-01 NOTE — Op Note (Signed)
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      SURGEON:  Pietro Cassis. Alvan Dame, M.D.      ASSISTANT:  Nehemiah Massed, PA-C     ANESTHESIA:  Spinal.      SPECIMENS:  None.      COMPLICATIONS:  None.      BLOOD LOSS:  350 cc     DRAINS:  None.      INDICATION OF THE PROCEDURE:  Lisa Hood is a 76 y.o. female who had   presented to office for evaluation of left hip pain.  Radiographs revealed   progressive degenerative changes with bone-on-bone   articulation to the  hip joint.  The patient had painful limited range of   motion significantly affecting their overall quality of life.  The patient was failing to    respond to conservative measures, and at this point was ready   to proceed with more definitive measures.  The patient has noted progressive   degenerative changes in his hip, progressive problems and dysfunction   with regarding the hip prior to surgery.  Consent was obtained for   benefit of pain relief.  Specific risk of infection, DVT, component   failure, dislocation, need for revision surgery, as well discussion of   the anterior versus posterior approach were reviewed.  Consent was   obtained for benefit of anterior pain relief through an anterior   approach.      PROCEDURE IN DETAIL:  The patient was brought to operative theater.    Once adequate anesthesia, preoperative antibiotics, 2gm of Ancef, 1 gm of Tranexamic Acid, and 10 mg of Decadron administered.   The patient was positioned supine on the OSI Hanna table.  Once adequate   padding of boney process was carried out, we had predraped out the hip, and  used fluoroscopy to confirm orientation of the pelvis and position.      The left hip was then prepped and draped from proximal iliac crest to   mid thigh with shower curtain technique.      Time-out was performed identifying the patient, planned procedure, and   extremity.     An incision was then made 2 cm distal and lateral to the   anterior superior iliac spine extending over the orientation of the   tensor fascia lata muscle and sharp dissection was carried down to the   fascia of the muscle and protractor placed in the soft tissues.  The fascia was then incised.  The muscle belly was identified and swept   laterally and retractor placed along the superior neck.  Following   cauterization of the circumflex vessels and removing some pericapsular   fat, a second cobra retractor was placed on the inferior neck.  A third   retractor was placed on the anterior acetabulum after elevating the   anterior rectus.  A L-capsulotomy was along the line of the   superior neck to the trochanteric fossa, then extended proximally and   distally.  Tag sutures were placed and the retractors were then placed   intracapsular.  We then identified the trochanteric fossa and   orientation of my neck cut, confirmed this radiographically   and then made a neck osteotomy with the femur on traction.  The femoral   head was removed without difficulty or complication.  Traction was let   off and retractors were placed posterior and anterior around the   acetabulum.      The labrum and foveal tissue were debrided.  I began reaming with a 25mm   reamer and reamed up to 53 mm reamer with good bony bed preparation and a 54 mm   cup was chosen.  The final 54 mm Pinnacle cup was then impacted under fluoroscopy  to confirm the depth of penetration and orientation with respect to   abduction.  A screw was placed followed by the hole eliminator.  The final   36+4 neutral Altrex liner was impacted with good visualized rim fit.  The cup was positioned anatomically within the acetabular portion of the pelvis.      At this point, the femur was rolled at 80 degrees.  Further capsule was   released off the inferior aspect of the femoral neck.  I then   released the superior capsule proximally.  The hook was placed laterally   along the femur and elevated manually and held in position with the bed   hook.  The leg was then extended and adducted with the leg rolled to 100   degrees of external rotation.  Once the proximal femur was fully   exposed, I used a box osteotome to set orientation.  I then began   broaching with the starting chili pepper broach and passed this by hand and then broached up to 5.  With the 5 broach in place I chose a high offset neck and did several trial reductions.  The offset was appropriate, leg lengths   appeared to be equal best matched with the +1.5 head ball confirmed radiographically.   Given these findings, I went ahead and dislocated the hip, repositioned all   retractors and positioned the right hip in the extended and abducted position.  The final 5 Hi Tri Lock stem was   chosen and it was impacted down to the level of neck cut.  Based on this   and the trial reduction, a 36+1.5 delta ceramic ball was chosen and   impacted onto a clean and dry trunnion, and the hip was reduced.  The   hip had been irrigated throughout the case again at this point.  I did   reapproximate the superior capsular leaflet to the anterior leaflet   using #1 Vicryl.  The fascia of the   tensor fascia lata muscle was then reapproximated using #1 Vicryl and #0 V-lock sutures.  The   remaining wound was closed with 2-0  Vicryl and running 4-0 Monocryl.   The  hip was cleaned, dried, and dressed sterilely using Dermabond and   Aquacel dressing.  She was then brought   to recovery room in stable condition tolerating the procedure well.    Nehemiah Massed, PA-C was present for the entirety of the case involved from   preoperative positioning, perioperative retractor management, general   facilitation of the case, as well as primary wound closure as assistant.            Pietro Cassis Alvan Dame, M.D.        08/01/2016 1:50 PM

## 2016-08-01 NOTE — Anesthesia Procedure Notes (Signed)
Procedure Name: MAC Date/Time: 08/01/2016 1:36 PM Performed by: Dione Booze Pre-anesthesia Checklist: Patient identified, Emergency Drugs available, Suction available and Patient being monitored Patient Re-evaluated:Patient Re-evaluated prior to inductionOxygen Delivery Method: Simple face mask Placement Confirmation: positive ETCO2

## 2016-08-01 NOTE — Anesthesia Postprocedure Evaluation (Signed)
Anesthesia Post Note  Patient: Lisa Hood  Procedure(s) Performed: Procedure(s) (LRB): LEFT TOTAL HIP ARTHROPLASTY ANTERIOR APPROACH (Left)  Patient location during evaluation: PACU Anesthesia Type: Spinal Level of consciousness: awake and alert Pain management: pain level controlled Vital Signs Assessment: post-procedure vital signs reviewed and stable Respiratory status: spontaneous breathing and respiratory function stable Cardiovascular status: blood pressure returned to baseline and stable Postop Assessment: no headache, no backache and spinal receding Anesthetic complications: no       Last Vitals:  Vitals:   08/01/16 1630 08/01/16 1645  BP: 101/66 (!) 112/47  Pulse: 74 63  Resp: 17 16  Temp: 36.3 C 36.6 C    Last Pain:  Vitals:   08/01/16 1652  TempSrc:   PainSc: 5                  [name]

## 2016-08-01 NOTE — Anesthesia Procedure Notes (Signed)
Spinal  Patient location during procedure: OR Staffing Anesthesiologist: CARIGNAN, PETER Performed: anesthesiologist  Preanesthetic Checklist Completed: patient identified, site marked, surgical consent, pre-op evaluation, timeout performed, IV checked, risks and benefits discussed and monitors and equipment checked Spinal Block Patient position: sitting Prep: Betadine Patient monitoring: heart rate, continuous pulse ox and blood pressure Approach: right paramedian Location: L3-4 Injection technique: single-shot Needle Needle type: Sprotte  Needle gauge: 24 G Needle length: 9 cm Additional Notes Expiration date of kit checked and confirmed. Patient tolerated procedure well, without complications.       

## 2016-08-01 NOTE — Interval H&P Note (Signed)
History and Physical Interval Note:  08/01/2016 12:36 PM  Lisa Hood  has presented today for surgery, with the diagnosis of Left hip osteoarthritis  The various methods of treatment have been discussed with the patient and family. After consideration of risks, benefits and other options for treatment, the patient has consented to  Procedure(s) with comments: LEFT TOTAL HIP ARTHROPLASTY ANTERIOR APPROACH (Left) - Requests 70 mins as a surgical intervention .  The patient's history has been reviewed, patient examined, no change in status, stable for surgery.  I have reviewed the patient's chart and labs.  Questions were answered to the patient's satisfaction.     Mauri Pole

## 2016-08-01 NOTE — Transfer of Care (Signed)
Immediate Anesthesia Transfer of Care Note  Patient: Lisa Hood  Procedure(s) Performed: Procedure(s) with comments: LEFT TOTAL HIP ARTHROPLASTY ANTERIOR APPROACH (Left) - Requests 70 mins  Patient Location: PACU  Anesthesia Type:MAC and Spinal  Level of Consciousness: awake, alert , oriented and patient cooperative  Airway & Oxygen Therapy: Patient Spontanous Breathing and Patient connected to face mask oxygen  Post-op Assessment: Report given to RN and Post -op Vital signs reviewed and stable  Post vital signs: Reviewed and stable  Last Vitals:  Vitals:   08/01/16 1127  BP: (!) 118/58  Pulse: 89  Resp: 18  Temp: 36.9 C    Last Pain:  Vitals:   08/01/16 1127  TempSrc: Oral         Complications: No apparent anesthesia complications

## 2016-08-01 NOTE — Interval H&P Note (Signed)
History and Physical Interval Note:  08/01/2016 12:24 PM  Lisa Hood  has presented today for surgery, with the diagnosis of Left hip osteoarthritis  The various methods of treatment have been discussed with the patient and family. After consideration of risks, benefits and other options for treatment, the patient has consented to  Procedure(s) with comments: LEFT TOTAL HIP ARTHROPLASTY ANTERIOR APPROACH (Left) - Requests 70 mins as a surgical intervention .  The patient's history has been reviewed, patient examined, no change in status, stable for surgery.  I have reviewed the patient's chart and labs.  Questions were answered to the patient's satisfaction.     Mauri Pole

## 2016-08-01 NOTE — Discharge Instructions (Signed)

## 2016-08-02 LAB — CBC
HCT: 32.7 % — ABNORMAL LOW (ref 36.0–46.0)
Hemoglobin: 11 g/dL — ABNORMAL LOW (ref 12.0–15.0)
MCH: 31.8 pg (ref 26.0–34.0)
MCHC: 33.6 g/dL (ref 30.0–36.0)
MCV: 94.5 fL (ref 78.0–100.0)
PLATELETS: 247 10*3/uL (ref 150–400)
RBC: 3.46 MIL/uL — AB (ref 3.87–5.11)
RDW: 13.5 % (ref 11.5–15.5)
WBC: 13.3 10*3/uL — AB (ref 4.0–10.5)

## 2016-08-02 LAB — BASIC METABOLIC PANEL
Anion gap: 4 — ABNORMAL LOW (ref 5–15)
BUN: 16 mg/dL (ref 6–20)
CALCIUM: 8.6 mg/dL — AB (ref 8.9–10.3)
CO2: 28 mmol/L (ref 22–32)
CREATININE: 0.62 mg/dL (ref 0.44–1.00)
Chloride: 105 mmol/L (ref 101–111)
GFR calc Af Amer: >60 mL/min (ref >60)
Glucose, Bld: 136 mg/dL — ABNORMAL HIGH (ref 65–99)
POTASSIUM: 4.6 mmol/L (ref 3.5–5.1)
SODIUM: 137 mmol/L (ref 135–145)

## 2016-08-02 MED ORDER — SODIUM CHLORIDE 0.9 % IV BOLUS (SEPSIS)
250.0000 mL | Freq: Once | INTRAVENOUS | Status: AC
  Administered 2016-08-02: 09:00:00 250 mL via INTRAVENOUS

## 2016-08-02 MED ORDER — DIPHENHYDRAMINE HCL 50 MG/ML IJ SOLN
12.5000 mg | Freq: Once | INTRAMUSCULAR | Status: AC
  Administered 2016-08-02: 12.5 mg via INTRAVENOUS
  Filled 2016-08-02: qty 1

## 2016-08-02 MED ORDER — METHYLPREDNISOLONE SODIUM SUCC 125 MG IJ SOLR
125.0000 mg | INTRAMUSCULAR | Status: AC
  Administered 2016-08-02: 125 mg via INTRAVENOUS
  Filled 2016-08-02: qty 2

## 2016-08-02 MED ORDER — FAMOTIDINE IN NACL 20-0.9 MG/50ML-% IV SOLN: 20.0000 mg | Freq: Once | INTRAVENOUS | Status: DC

## 2016-08-02 MED ORDER — FAMOTIDINE 20 MG PO TABS
20.0000 mg | ORAL_TABLET | Freq: Once | ORAL | Status: AC
  Administered 2016-08-02: 17:00:00 20 mg via ORAL
  Filled 2016-08-02: qty 1

## 2016-08-02 NOTE — Evaluation (Signed)
Occupational Therapy Evaluation Patient Details Name: Lisa Hood MRN: [id number] DOB: 02/03/1941 Today's Date: 08/02/2016    History of Present Illness 76 yo female s/p L THA-direct anterior 08/01/16   Clinical Impression   Pt was admitted for the above sx. All education was completed. No further OT is needed at this time    Follow Up Recommendations  No OT follow up;Supervision/Assistance - 24 hour    Equipment Recommendations  3 in 1 bedside commode    Recommendations for Other Services       Precautions / Restrictions Precautions Precautions: Fall Restrictions Weight Bearing Restrictions: No LLE Weight Bearing: Weight bearing as tolerated      Mobility Bed Mobility Overal bed mobility: Needs Assistance Bed Mobility: Supine to Sit     Supine to sit: Min guard;HOB elevated     General bed mobility comments: OOB by PT  Transfers Overall transfer level: Needs assistance Equipment used: Rolling walker (2 wheeled) Transfers: Sit to/from Stand Sit to Stand: Min guard         General transfer comment: VCs safety, technique, hand/LE placement. close guard for safety    Balance                                            ADL Overall ADL's : Needs assistance/impaired     Grooming: Wash/dry hands;Standing;Supervision/safety   Upper Body Bathing: Set up;Sitting   Lower Body Bathing: Min guard;Sit to/from stand;With adaptive equipment   Upper Body Dressing : Set up;Sitting   Lower Body Dressing: Minimal assistance;Sit to/from stand;With adaptive equipment   Toilet Transfer: Min guard;BSC;RW;Ambulation   Toileting- Water quality scientist and Hygiene: Min guard;Sit to/from stand   Tub/ Shower Transfer: Walk-in shower;Min guard;Ambulation;Rolling walker   Functional mobility during ADLs: Min guard General ADL Comments: reviewed and practiced with AE.  Pt has a long brush at home and a reacher. Used sock aide, but she will need  assistance with ted hose, so daughter could just put socks on if she wanted A     Vision         Perception     Praxis      Pertinent Vitals/Pain Pain Assessment: 0-10 Pain Score: 6  Pain Location: L hip/thigh Pain Descriptors / Indicators: Sore;Tightness Pain Intervention(s): Limited activity within patient's tolerance;Monitored during session;Premedicated before session;Repositioned;Ice applied     Hand Dominance     Extremity/Trunk Assessment Upper Extremity Assessment Upper Extremity Assessment: Overall WFL for tasks assessed      Cervical / Trunk Assessment Cervical / Trunk Assessment: Normal   Communication Communication Communication: No difficulties   Cognition Arousal/Alertness: Awake/alert Behavior During Therapy: WFL for tasks assessed/performed Overall Cognitive Status: Within Functional Limits for tasks assessed                     General Comments       Exercises       Shoulder Instructions      Home Living Family/patient expects to be discharged to:: Private residence Living Arrangements: Children Available Help at Discharge: Family Type of Home: House Home Access: Stairs to enter Technical brewer of Steps: 4 Entrance Stairs-Rails: None Home Layout: Two level;Bed/bath upstairs     Bathroom Shower/Tub: Occupational psychologist: Standard     Home Equipment: Environmental consultant - 2 wheels          Prior  Functioning/Environment Level of Independence: Independent                 OT Problem List:        OT Treatment/Interventions:      OT Goals(Current goals can be found in the care plan section) Acute Rehab OT Goals Patient Stated Goal: regain independence OT Goal Formulation: All assessment and education complete, DC therapy  OT Frequency:     Barriers to D/C:            Co-evaluation              End of Session    Activity Tolerance: Patient tolerated treatment well Patient left: in chair;with  call bell/phone within reach;with chair alarm set  OT Visit Diagnosis: Pain Pain - Right/Left: Left Pain - part of body: Hip                ADL either performed or assessed with clinical judgement  Time: [id number] OT Time Calculation (min): 37 min Charges:  OT General Charges $OT Visit: 1 Procedure OT Evaluation $OT Eval Low Complexity: 1 Procedure OT Treatments $Self Care/Home Management : 8-22 mins G-Codes:     Twilla Ishida, OTR/L [id number] 08/02/2016  Remon Quinto 08/02/2016, 11:53 AM

## 2016-08-02 NOTE — Evaluation (Signed)
Physical Therapy Evaluation Patient Details Name: Lisa Hood MRN: [id number] DOB: July 29, 1940 Today's Date: 08/02/2016   History of Present Illness  76 yo female s/p L THA-direct anterior 08/01/16  Clinical Impression  On eval, pt required Min guard assist for mobility. She walked ~75 feet with a RW. Pain rated 6/10 with activity. Will plan to have a 2nd session to practice stair negotiation prior to possible d/c later today.     Follow Up Recommendations No PT follow up;Supervision for mobility/OOB    Equipment Recommendations  None recommended by PT (pt has borrowed a walker)    Recommendations for Other Services       Precautions / Restrictions Precautions Precautions: Fall Restrictions Weight Bearing Restrictions: No LLE Weight Bearing: Weight bearing as tolerated      Mobility  Bed Mobility Overal bed mobility: Needs Assistance Bed Mobility: Supine to Sit     Supine to sit: Min guard;HOB elevated     General bed mobility comments: close guard for safety. Increased time. VCs safety, technique.   Transfers Overall transfer level: Needs assistance Equipment used: Rolling walker (2 wheeled) Transfers: Sit to/from Stand Sit to Stand: Min guard         General transfer comment: VCs safety, technique, hand/LE placement. close guard for safety  Ambulation/Gait Ambulation/Gait assistance: Min guard Ambulation Distance (Feet): 75 Feet Assistive device: Rolling walker (2 wheeled) Gait Pattern/deviations: Decreased stride length;Step-to pattern;Step-through pattern     General Gait Details: VCs safety, technique, sequence. Beginning to use reciprocal gait pattern.   Stairs            Wheelchair Mobility    Modified Rankin (Stroke Patients Only)       Balance                                             Pertinent Vitals/Pain Pain Assessment: 0-10 Pain Score: 6  Pain Location: L hip/thigh Pain Descriptors / Indicators:  Sore;Tightness Pain Intervention(s): Monitored during session;Repositioned;Ice applied    Home Living Family/patient expects to be discharged to:: Private residence Living Arrangements: Children (daughter; works part time 3 1/2 days week) Available Help at Discharge: Family Type of Home: House Home Access: Stairs to enter Entrance Stairs-Rails: None Technical brewer of Steps: 4 Home Layout: Two level;Bed/bath upstairs Home Equipment: Environmental consultant - 2 wheels      Prior Function Level of Independence: Independent               Hand Dominance        Extremity/Trunk Assessment   Upper Extremity Assessment Upper Extremity Assessment: Defer to OT evaluation    Lower Extremity Assessment Lower Extremity Assessment: Generalized weakness (s/p L THA)    Cervical / Trunk Assessment Cervical / Trunk Assessment: Normal  Communication   Communication: No difficulties  Cognition Arousal/Alertness: Awake/alert Behavior During Therapy: WFL for tasks assessed/performed Overall Cognitive Status: Within Functional Limits for tasks assessed                      General Comments      Exercises Total Joint Exercises Ankle Circles/Pumps: AROM;Both;10 reps;Supine Quad Sets: AROM;Both;10 reps;Supine Heel Slides: AAROM;Left;10 reps;Supine Hip ABduction/ADduction: AAROM;Left;10 reps;Supine   Assessment/Plan    PT Assessment Patient needs continued PT services  PT Problem List Decreased strength;Decreased mobility;Decreased range of motion;Decreased activity tolerance;Decreased balance;Decreased knowledge of use of DME  PT Treatment Interventions DME instruction;Therapeutic activities;Gait training;Therapeutic exercise;Patient/family education;Functional mobility training;Balance training    PT Goals (Current goals can be found in the Care Plan section)  Acute Rehab PT Goals Patient Stated Goal: regain independence PT Goal Formulation: With patient Time For Goal  Achievement: 08/16/16 Potential to Achieve Goals: Good    Frequency 7X/week   Barriers to discharge        Co-evaluation               End of Session Equipment Utilized During Treatment: Gait belt Activity Tolerance: Patient tolerated treatment well Patient left: in chair;with call bell/phone within reach;with chair alarm set   PT Visit Diagnosis: Difficulty in walking, not elsewhere classified (R26.2)         Time: [id number] PT Time Calculation (min) (ACUTE ONLY): 25 min   Charges:   PT Evaluation $PT Eval Low Complexity: 1 Procedure PT Treatments $Gait Training: 8-22 mins   PT G Codes:         Weston Anna, MPT Pager: [phone]

## 2016-08-02 NOTE — Progress Notes (Signed)
Physical Therapy Treatment Patient Details Name: Lisa Hood MRN: [id number] DOB: 25-Mar-1941 Today's Date: 08/02/2016    History of Present Illness 76 yo female s/p L THA-direct anterior 08/01/16    PT Comments    Progressing with mobility. Reviewed/practiced exercises, gait training, and stair training. Issued HEP for pt to perform 2x/day. Instructed pt not to climbs stairs or bathe without her daughter supervising/assisting. All education completed-made RN aware.     Follow Up Recommendations  No PT follow up;Supervision/Assistance - 24 hour     Equipment Recommendations  Rolling walker with 5" wheels    Recommendations for Other Services       Precautions / Restrictions Precautions Precautions: Fall Restrictions Weight Bearing Restrictions: No LLE Weight Bearing: Weight bearing as tolerated    Mobility  Bed Mobility               General bed mobility comments: OOB in recliner  Transfers Overall transfer level: Needs assistance Equipment used: Rolling walker (2 wheeled) Transfers: Sit to/from Stand Sit to Stand: Min guard         General transfer comment: VCs safety, technique, hand/LE placement. close guard for safety  Ambulation/Gait Ambulation/Gait assistance: Min guard Ambulation Distance (Feet): 175 Feet Assistive device: Rolling walker (2 wheeled) Gait Pattern/deviations: Step-to pattern;Step-through pattern;Decreased stride length     General Gait Details: VCs safety, technique, sequence, posture, distance from walker. Beginning to use reciprocal gait pattern.    Stairs Stairs: Yes Min assist Stair Management: Forwards;One rail Left Number of Stairs: 2 (x2) General stair comments: VCs safety, technique, sequence. Practiced x1 with 1 HHA to simulate entry into front door (no railing). Practiced x1 with 1 handrail to simulate getting up to 2nd floor.    Wheelchair Mobility    Modified Rankin (Stroke Patients Only)       Balance                                     Cognition Arousal/Alertness: Awake/alert Behavior During Therapy: WFL for tasks assessed/performed Overall Cognitive Status: Within Functional Limits for tasks assessed                      Exercises Total Joint Exercises Hip ABduction/ADduction: AROM;Left;10 reps;Standing Long Arc Quad: AROM;Left;10 reps;Seated Knee Flexion: AROM;Left;10 reps;Standing Marching in Standing: AROM;Both;10 reps;Standing General Exercises - Lower Extremity Heel Raises: AROM;Both;10 reps;Standing    General Comments        Pertinent Vitals/Pain Pain Assessment: 0-10 Pain Score: 6  Pain Location: L hip/thigh Pain Descriptors / Indicators: Sore;Tightness Pain Intervention(s): Monitored during session;Repositioned    Home Living Family/patient expects to be discharged to:: Private residence Living Arrangements: Children           Home Equipment: Environmental consultant - 2 wheels      Prior Function Level of Independence: Independent          PT Goals (current goals can now be found in the care plan section) Acute Rehab PT Goals Patient Stated Goal: regain independence PT Goal Formulation: With patient Time For Goal Achievement: 08/16/16 Potential to Achieve Goals: Good Progress towards PT goals: Progressing toward goals    Frequency    7X/week      PT Plan Current plan remains appropriate    Co-evaluation             End of Session Equipment Utilized During Treatment: Gait belt Activity Tolerance:  Patient tolerated treatment well Patient left: in chair;with call bell/phone within reach   PT Visit Diagnosis: Difficulty in walking, not elsewhere classified (R26.2)     Time: [id number] PT Time Calculation (min) (ACUTE ONLY): 36 min  Charges:  $Gait Training: 8-22 mins $Therapeutic Exercise: 8-22 mins                    G Codes:       Weston Anna, MPT Pager: [phone]

## 2016-08-02 NOTE — Significant Event (Signed)
Rapid Response Event Note  Overview:      Initial Focused Assessment:   Interventions:  Plan of Care (if not transferred):Monitor VS. Call RRT if further assistance needed.  Event Summary:Called to 1612 for assistance with patient feeling funny in left arm and chest burning. Patient could not describe the feeling in her arm. It is not pulling or tingling, just feels different. Chest across throat area is burning and voice is very hoarse. Patient is a little anxious at this time. MD called and order received for Solulmedrol IVP and Pepcid IVPB. VS: BP 148/62(77), HR 84, RR 18, Sats 98% on RA. Throat visualized - does not appear edematous or reddened. Able to swallow water without difficulty. Reassured patient that ETT's pass through vocal cords and sometimes hoarseness is not uncommon. Patient shaking from being cold. Covered with warm blanket. RRT informed bedside RN to call if further assistance needed. Patient able to converse more audibly now.    at      at          [place], [place]

## 2016-08-02 NOTE — Progress Notes (Signed)
Patient C/O "burning throat" and "throat is closing." Pulse ox 96% on room air, respirations 16. Maaxlox given for burning and PO Benadryl. PA [employer] notified. IV Benadryl ordered and given. At 1630 patient complained of worsening throat closure. Rapid response nurse called.

## 2016-08-02 NOTE — Progress Notes (Signed)
     Subjective: 1 Day Post-Op Procedure(s) (LRB): LEFT TOTAL HIP ARTHROPLASTY ANTERIOR APPROACH (Left)   Patient reports pain as mild, pain controlled. No events throughout the night.  Dr. Alvan Dame discussed with the patient regarding the severity of her OA.  Ready to be discharged home if she does well with PT.  Objective:   VITALS:   Vitals:   08/02/16 0139 08/02/16 0553  BP: (!) 92/49 (!) 94/57  Pulse: (!) 57 (!) 57  Resp: 16 16  Temp: 98 F (36.7 C) 97.7 F (36.5 C)    Dorsiflexion/Plantar flexion intact Incision: dressing C/D/I No cellulitis present Compartment soft  LABS  Recent Labs  08/02/16 0426  HGB 11.0*  HCT 32.7*  WBC 13.3*  PLT 247     Recent Labs  08/02/16 0426  NA 137  K 4.6  BUN 16  CREATININE 0.62  GLUCOSE 136*     Assessment/Plan: 1 Day Post-Op Procedure(s) (LRB): LEFT TOTAL HIP ARTHROPLASTY ANTERIOR APPROACH (Left) Foley cath d/c'ed Advance diet Up with therapy D/C IV fluids Discharge home Follow up in 2 weeks at [facility]. Follow up with OLIN,Lary Eckardt D in 2 weeks.  Contact information:  [facility] [address] [place] Lisa Hood   PAC  08/02/2016, 8:42 AM

## 2016-08-02 NOTE — Care Management Note (Signed)
Case Management Note  Patient Details  Name: Lisa Hood MRN: [id number] Date of Birth: [birth year]  Subjective/Objective:                  LEFT TOTAL HIP ARTHROPLASTY ANTERIOR APPROACH (Left)  Action/Plan: Discharge planning Expected Discharge Date:  08/02/16               Expected Discharge Plan:  Home/Self Care  In-House Referral:     Discharge planning Services  CM Consult  Post Acute Care Choice:  NA Choice offered to:  Patient  DME Arranged:  3-N-1, Walker rolling DME Agency:  [place]:  NA [place] Agency:  NA  Status of Service:  Completed, signed off  If discussed at [place] of Stay Meetings, dates discussed:    Additional Comments: CM met with pt to confirm no HH services ordered or recc; pt confirms. CM notified [place] DME rep, Joelene Millin to please deliver the rolling walker and 3n1 to room prior to discharge. No other CM needs were communicated. Dellie Catholic, RN 08/02/2016, 12:36 PM

## 2016-08-03 LAB — BASIC METABOLIC PANEL
ANION GAP: 7 (ref 5–15)
BUN: 20 mg/dL (ref 6–20)
CALCIUM: 8.8 mg/dL — AB (ref 8.9–10.3)
CO2: 28 mmol/L (ref 22–32)
Chloride: 104 mmol/L (ref 101–111)
Creatinine, Ser: 0.79 mg/dL (ref 0.44–1.00)
GFR calc Af Amer: >60 mL/min (ref >60)
GFR calc non Af Amer: >60 mL/min (ref >60)
GLUCOSE: 119 mg/dL — AB (ref 65–99)
Potassium: 5.2 mmol/L — ABNORMAL HIGH (ref 3.5–5.1)
Sodium: 139 mmol/L (ref 135–145)

## 2016-08-03 LAB — CBC
HEMATOCRIT: 31.7 % — AB (ref 36.0–46.0)
Hemoglobin: 10.3 g/dL — ABNORMAL LOW (ref 12.0–15.0)
MCH: 30.7 pg (ref 26.0–34.0)
MCHC: 32.5 g/dL (ref 30.0–36.0)
MCV: 94.6 fL (ref 78.0–100.0)
Platelets: 252 10*3/uL (ref 150–400)
RBC: 3.35 MIL/uL — ABNORMAL LOW (ref 3.87–5.11)
RDW: 13.8 % (ref 11.5–15.5)
WBC: 15.8 10*3/uL — ABNORMAL HIGH (ref 4.0–10.5)

## 2016-08-03 NOTE — Progress Notes (Signed)
Physical Therapy Treatment Patient Details Name: Lisa Hood MRN: [id number] DOB: [birth year] Today's Date: 08/03/2016    History of Present Illness 76 yo female s/p L THA-direct anterior 08/01/16    PT Comments    POD 2 am session Practiced stairs with pt.  Will need another session to educate daughter.   Follow Up Recommendations  No PT follow up;Supervision/Assistance - 24 hour     Equipment Recommendations  Rolling walker with 5" wheels    Recommendations for Other Services       Precautions / Restrictions Precautions Precautions: Fall Restrictions Weight Bearing Restrictions: No LLE Weight Bearing: Weight bearing as tolerated    Mobility  Bed Mobility               General bed mobility comments: OOB in recliner  Transfers Overall transfer level: Needs assistance Equipment used: Rolling walker (2 wheeled) Transfers: Sit to/from Stand Sit to Stand: Min guard         General transfer comment: 50% VCs safety, technique, hand/LE placement. close guard for safety  Ambulation/Gait Ambulation/Gait assistance: Min guard Ambulation Distance (Feet): 85 Feet Assistive device: Rolling walker (2 wheeled) Gait Pattern/deviations: Step-to pattern;Step-through pattern;Decreased stride length     General Gait Details: 50% VCs safety, technique, sequence, posture, distance from walker.   Stairs     Stair Management: Forwards;One rail Left;Step to pattern;Backwards Number of Stairs: 8 General stair comments: up backward with walker (front no rails) and up forward with rail (up to bedroom) with 75% VC's on proper tech  Wheelchair Mobility    Modified Rankin (Stroke Patients Only)       Balance                                    Cognition Arousal/Alertness: Awake/alert                     General Comments: high anxiety and easily distracted    Exercises   Total Hip Replacement TE's 10 reps ankle pumps 10 reps knee  presses 10 reps heel slides 10 reps SAQ's 10 reps ABD Followed by [facility]     General Comments        Pertinent Vitals/Pain Pain Assessment: 0-10 Pain Score: 5  Pain Location: L hip/thigh Pain Descriptors / Indicators: Sore;Tightness Pain Intervention(s): Monitored during session;Repositioned;Ice applied    Home Living                      Prior Function            PT Goals (current goals can now be found in the care plan section) Progress towards PT goals: Progressing toward goals    Frequency    7X/week      PT Plan Current plan remains appropriate    Co-evaluation             End of Session Equipment Utilized During Treatment: Gait belt Activity Tolerance: Patient tolerated treatment well Patient left: in chair;with call bell/phone within reach   PT Visit Diagnosis: Difficulty in walking, not elsewhere classified (R26.2)     Time: 1110-1135 PT Time Calculation (min) (ACUTE ONLY): 25 min  Charges:  $Gait Training: 8-22 mins $Therapeutic Exercise: 23-37 mins                    G Codes:       Rica Koyanagi  PTA [facility]  Acute  Rehab Pager      [phone]

## 2016-08-03 NOTE — Progress Notes (Signed)
     Subjective: 2 Days Post-Op Procedure(s) (LRB): LEFT TOTAL HIP ARTHROPLASTY ANTERIOR APPROACH (Left)   Patient reports pain as mild, pain well controlled. Incident yesterday with tightness across chest and a feeling as the throat was tight.  Rapid response was called and a few medications were administered.  Patient stated that she is feeling much better since that incident.  No issues since.  She has absolutely love the facility and care she has received here.  Labs pending, they were late processing today.   Patient feels that they are ready to be discharged home.   Objective:   VITALS:   Vitals:   08/02/16 2132 08/03/16 0607  BP: (!) 149/66 (!) 99/52  Pulse: 85 60  Resp: 16 16  Temp: 98 F (36.7 C) 97.9 F (36.6 C)    Dorsiflexion/Plantar flexion intact Incision: dressing C/D/I No cellulitis present Compartment soft  LABS  Recent Labs  08/02/16 0426  HGB 11.0*  HCT 32.7*  WBC 13.3*  PLT 247     Recent Labs  08/02/16 0426  NA 137  K 4.6  BUN 16  CREATININE 0.62  GLUCOSE 136*     Assessment/Plan: 2 Days Post-Op Procedure(s) (LRB): LEFT TOTAL HIP ARTHROPLASTY ANTERIOR APPROACH (Left) Up with therapy Discharge home Follow up in 2 weeks at [facility]. Follow up with OLIN,Jeanelle Dake D in 2 weeks.  Contact information:  [facility] [address] [place]   PAC  08/03/2016, 7:42 AM

## 2016-08-03 NOTE — Progress Notes (Signed)
  Physical Therapy Treatment Patient Details Name: Lisa Hood MRN: [id number] DOB: 21-Apr-1941 Today's Date: 08/03/2016    History of Present Illness 76 yo female s/p L THA-direct anterior 08/01/16    PT Comments    POD # 2 pm session With daughter present practiced stairs. Pt ready for D/C to home   Follow Up Recommendations  No PT follow up;Supervision/Assistance - 24 hour     Equipment Recommendations  Rolling walker with 5" wheels    Recommendations for Other Services       Precautions / Restrictions Precautions Precautions: Fall Restrictions Weight Bearing Restrictions: No LLE Weight Bearing: Weight bearing as tolerated    Mobility  Bed Mobility               General bed mobility comments: OOB in recliner  Transfers Overall transfer level: Needs assistance Equipment used: Rolling walker (2 wheeled) Transfers: Sit to/from Stand Sit to Stand: Min guard         General transfer comment: 50% VCs safety, technique, hand/LE placement. close guard for safety  Ambulation/Gait Ambulation/Gait assistance: Min guard Ambulation Distance (Feet): 85 Feet Assistive device: Rolling walker (2 wheeled) Gait Pattern/deviations: Step-to pattern;Step-through pattern;Decreased stride length     General Gait Details: 50% VCs safety, technique, sequence, posture, distance from walker.   Stairs     Stair Management: Forwards;One rail Left;Step to pattern;Backwards Number of Stairs: 8 General stair comments: up backward with walker (front no rails) and up forward with rail (up to bedroom) with 75% VC's on proper tech  Wheelchair Mobility    Modified Rankin (Stroke Patients Only)       Balance                                    Cognition Arousal/Alertness: Awake/alert                     General Comments: high anxiety and easily distracted    Exercises      General Comments        Pertinent Vitals/Pain Pain Assessment:  0-10 Pain Score: 5  Pain Location: L hip/thigh Pain Descriptors / Indicators: Sore;Tightness Pain Intervention(s): Monitored during session;Repositioned;Ice applied    Home Living                      Prior Function            PT Goals (current goals can now be found in the care plan section) Progress towards PT goals: Progressing toward goals    Frequency    7X/week      PT Plan Current plan remains appropriate    Co-evaluation             End of Session Equipment Utilized During Treatment: Gait belt Activity Tolerance: Patient tolerated treatment well Patient left: in chair;with call bell/phone within reach   PT Visit Diagnosis: Difficulty in walking, not elsewhere classified (R26.2)     Time: [id number] PT Time Calculation (min) (ACUTE ONLY): 24 min  Charges:  $Gait Training: 8-22 mins $Therapeutic Activity: 8-22 mins                    G Codes:       Rica Koyanagi  PTA [facility]  Acute  Rehab Pager      [phone]

## 2016-08-06 ENCOUNTER — Emergency Department (HOSPITAL_BASED_OUTPATIENT_CLINIC_OR_DEPARTMENT_OTHER)
Admission: EM | Admit: 2016-08-06 | Discharge: 2016-08-06 | Disposition: A | Discharge status: Home / Self Care | Payer: Medicare HMO | Attending: Physician Assistant | Admitting: Physician Assistant

## 2016-08-06 ENCOUNTER — Encounter (HOSPITAL_BASED_OUTPATIENT_CLINIC_OR_DEPARTMENT_OTHER): Payer: Self-pay | Admitting: Emergency Medicine

## 2016-08-06 ENCOUNTER — Emergency Department (HOSPITAL_BASED_OUTPATIENT_CLINIC_OR_DEPARTMENT_OTHER): Payer: Medicare HMO

## 2016-08-06 DIAGNOSIS — Z87891 Personal history of nicotine dependence: Secondary | ICD-10-CM | POA: Diagnosis not present

## 2016-08-06 DIAGNOSIS — R0981 Nasal congestion: Secondary | ICD-10-CM | POA: Diagnosis present

## 2016-08-06 DIAGNOSIS — Z853 Personal history of malignant neoplasm of breast: Secondary | ICD-10-CM | POA: Diagnosis not present

## 2016-08-06 DIAGNOSIS — Z79899 Other long term (current) drug therapy: Secondary | ICD-10-CM | POA: Insufficient documentation

## 2016-08-06 DIAGNOSIS — J189 Pneumonia, unspecified organism: Secondary | ICD-10-CM | POA: Diagnosis not present

## 2016-08-06 DIAGNOSIS — Z7982 Long term (current) use of aspirin: Secondary | ICD-10-CM | POA: Insufficient documentation

## 2016-08-06 LAB — COMPREHENSIVE METABOLIC PANEL
ALBUMIN: 3.4 g/dL — AB (ref 3.5–5.0)
ALT: 31 U/L (ref 14–54)
ANION GAP: 6 (ref 5–15)
AST: 36 U/L (ref 15–41)
Alkaline Phosphatase: 78 U/L (ref 38–126)
BILIRUBIN TOTAL: 0.6 mg/dL (ref 0.3–1.2)
BUN: 11 mg/dL (ref 6–20)
CO2: 28 mmol/L (ref 22–32)
Calcium: 9 mg/dL (ref 8.9–10.3)
Chloride: 102 mmol/L (ref 101–111)
Creatinine, Ser: 0.6 mg/dL (ref 0.44–1.00)
GFR calc non Af Amer: >60 mL/min (ref >60)
GLUCOSE: 101 mg/dL — AB (ref 65–99)
Potassium: 3.9 mmol/L (ref 3.5–5.1)
Sodium: 136 mmol/L (ref 135–145)
TOTAL PROTEIN: 6.6 g/dL (ref 6.5–8.1)

## 2016-08-06 LAB — CBC WITH DIFFERENTIAL/PLATELET
BASOS ABS: 0 10*3/uL (ref 0.0–0.1)
Basophils Relative: 0 %
Eosinophils Absolute: 0.3 10*3/uL (ref 0.0–0.7)
Eosinophils Relative: 3 %
HEMATOCRIT: 35.6 % — AB (ref 36.0–46.0)
HEMOGLOBIN: 12.1 g/dL (ref 12.0–15.0)
LYMPHS PCT: 15 %
Lymphs Abs: 1.7 10*3/uL (ref 0.7–4.0)
MCH: 32.6 pg (ref 26.0–34.0)
MCHC: 34 g/dL (ref 30.0–36.0)
MCV: 96 fL (ref 78.0–100.0)
MONO ABS: 1 10*3/uL (ref 0.1–1.0)
Monocytes Relative: 9 %
NEUTROS ABS: 8.3 10*3/uL — AB (ref 1.7–7.7)
NEUTROS PCT: 73 %
Platelets: 274 10*3/uL (ref 150–400)
RBC: 3.71 MIL/uL — AB (ref 3.87–5.11)
RDW: 14 % (ref 11.5–15.5)
WBC: 11.3 10*3/uL — AB (ref 4.0–10.5)

## 2016-08-06 MED ORDER — LEVOFLOXACIN 500 MG PO TABS: 500.0000 mg | ORAL_TABLET | Freq: Every day | ORAL | 0 refills | Status: AC

## 2016-08-06 MED ORDER — LEVOFLOXACIN 500 MG PO TABS
500.0000 mg | ORAL_TABLET | Freq: Once | ORAL | Status: AC
  Administered 2016-08-06: 500 mg via ORAL
  Filled 2016-08-06: qty 1

## 2016-08-06 MED ORDER — IOPAMIDOL (ISOVUE-370) INJECTION 76%
100.0000 mL | Freq: Once | INTRAVENOUS | Status: AC | PRN
  Administered 2016-08-06: 100 mL via INTRAVENOUS

## 2016-08-06 NOTE — Discharge Instructions (Signed)
Please return with any shortness of breath or other concerns.

## 2016-08-06 NOTE — ED Triage Notes (Signed)
Pt reports she had L hip replacement Tuesday. Felt like she had some congestion this morning that was choking her. She called her doctor who told her to go to [facility] but she was unable to get down the stairs so she called EMS. Pt states she was finally able to cough the mucous up and it was dark colored. Pt is feeling better at this time.

## 2016-08-06 NOTE — ED Notes (Signed)
IV attempted x2 without success.

## 2016-08-06 NOTE — ED Provider Notes (Signed)
[place] DEPT [facility] Provider Note   CSN: [id number] Arrival date & time: 08/06/16  0907     History   Chief Complaint Chief Complaint  Patient presents with  . Nasal Congestion    HPI Lisa Hood is a 76 y.o. female.  HPI   Patient is a 76 year old female presenting with coughing up blood-tinged sputum, cough,  as well as feeling of "congestion".  No fevers, no chest pain . Patient had hip surgery 5 days ago, discharged 2 days ago.  Patient has not been doing her hip exercises because she "lost the sheet". Patient reports that she also has had a decrease in her ability to incentive spirometer. She says she just feels like she can't take his as big deep breaths that she could initially.     Past Medical History:  Diagnosis Date  . Arthritis    oa  . Cancer [facility]) 1999   right breast  . Headache    hx of vascular 40 yrs ago  . History of kidney stones 1991  . Pain in both feet    at night only toes are in a tight shoe occ pain in middle of a foot at times    Patient Active Problem List   Diagnosis Date Noted  . 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 BREAST BIOPSY Bilateral 1999  . BREAST SURGERY  1991   right mastectomy with lymph node removal  . breat lumpectomy Left 1999  . CHOLECYSTECTOMY  1989  . CRYOABLATION     of cervix  . DILATION AND CURETTAGE OF UTERUS  1975 and 1990's   x 2  . EXTRACORPOREAL SHOCK WAVE LITHOTRIPSY  1991  . JOINT REPLACEMENT    . pac insertion and removal    . TONSILLECTOMY  age 13  . TOTAL HIP ARTHROPLASTY Left 08/01/2016   Procedure: LEFT TOTAL HIP ARTHROPLASTY ANTERIOR APPROACH;  Surgeon: Paralee Cancel, MD;  Location: [facility];  Service: Orthopedics;  Laterality: Left;  Requests 70 mins    OB History    No data available       Home Medications    Prior to Admission medications   Medication Sig Start Date End Date Taking? Authorizing Provider  aspirin 81 MG chewable  tablet Chew 1 tablet (81 mg total) by mouth 2 (two) times daily. Take for 4 weeks. 08/01/16   Danae Orleans, PA-C  Calcium Carbonate-Vitamin D (CALCIUM 600+D HIGH POTENCY PO) Take 1 tablet by mouth daily.    Historical Provider, MD  Cholecalciferol (D3 HIGH POTENCY) 2000 units CAPS Take 2,000 Units by mouth daily.    Historical Provider, MD  docusate sodium (COLACE) 100 MG capsule Take 1 capsule (100 mg total) by mouth 2 (two) times daily. 08/01/16   Danae Orleans, PA-C  ferrous sulfate (FERROUSUL) 325 (65 FE) MG tablet Take 1 tablet (325 mg total) by mouth 3 (three) times daily with meals. 08/01/16   Danae Orleans, PA-C  HYDROcodone-acetaminophen (NORCO) 7.5-325 MG tablet Take 1-2 tablets by mouth every 4 (four) hours as needed for moderate pain or severe pain. 08/01/16   Danae Orleans, PA-C  methocarbamol (ROBAXIN) 500 MG tablet Take 1 tablet (500 mg total) by mouth every 6 (six) hours as needed for muscle spasms. 08/01/16   Danae Orleans, PA-C  Multiple Vitamin (MULTIVITAMIN WITH MINERALS) TABS tablet Take 1 tablet by mouth daily.    Historical Provider, MD  polyethylene glycol (MIRALAX / GLYCOLAX) packet  Take 17 g by mouth 2 (two) times daily. 08/01/16   Danae Orleans, PA-C    Family History No family history on file.  Social History Social History  Substance Use Topics  . Smoking status: Former Smoker    Packs/day: 1.00    Years: 30.00    Types: Cigarettes    Quit date: 03/05/1998  . Smokeless tobacco: Never Used  . Alcohol use Yes     Comment: occ     Allergies   Ferrous sulfate   Review of Systems Review of Systems  Constitutional: Negative for activity change.  Respiratory: Positive for cough and shortness of breath.   Cardiovascular: Positive for leg swelling. Negative for chest pain.  Gastrointestinal: Negative for abdominal pain.  All other systems reviewed and are negative.    Physical Exam Updated Vital Signs BP 122/60 (BP Location: Left Arm)   Pulse 70    Temp 98.3 F (36.8 C) (Oral)   Resp 20   Ht 5\' 3"  (1.6 m)   Wt 125 lb (56.7 kg)   SpO2 96%   BMI 22.14 kg/m   Physical Exam  Constitutional: She is oriented to person, place, and time. She appears well-developed and well-nourished.  HENT:  Head: Normocephalic and atraumatic.  Eyes: Right eye exhibits no discharge.  Cardiovascular: Normal rate and regular rhythm.   Pulmonary/Chest: Effort normal. No respiratory distress.  Basis have mild crackles/rhonchi.  Musculoskeletal:  Left hip incision with no surrounding evidence of infection.  Neurological: She is oriented to person, place, and time.  Skin: Skin is warm and dry. She is not diaphoretic.  Psychiatric: She has a normal mood and affect.  Nursing note and vitals reviewed.    ED Treatments / Results  Labs (all labs ordered are listed, but only abnormal results are displayed) Labs Reviewed - No data to display  EKG  EKG Interpretation None       Radiology No results found.  Procedures Procedures (including critical care time)  Medications Ordered in ED Medications - No data to display   Initial Impression / Assessment and Plan / ED Course  I have reviewed the triage vital signs and the nursing notes.  Pertinent labs & imaging results that were available during my care of the patient were reviewed by me and considered in my medical decision making (see chart for details).     She was well-appearing 76 year old female status post 5 days from left hip surgery. She reports she sees having increasing sputum with specs and blood and difficulty iincentive spirometer the same amount as she had before. Given her presentation and her recent surgery patient is high risk for pulmonary embolism. We'll do CT PE.  11:38 AM CT PE is negative. Does show pneumonia. We'll treat with Levaquin. Patient is not hypoxic nor tachypneic we think it is safe to treat as an outpatient. We will have her follow-up with her primary care  this week,  Also encourage her to mobilize as much as possible.   Final Clinical Impressions(s) / ED Diagnoses   Final diagnoses:  None    New Prescriptions New Prescriptions   No medications on file     Aneesah Hernan Julio Alm, MD 08/06/16 1139

## 2016-08-06 NOTE — Discharge Summary (Signed)
Physician Discharge Summary  Patient ID: SEVILLA BEANE MRN: [id number] DOB/AGE: 12-15-1940 76 y.o.  Admit date: 08/01/2016 Discharge date: 08/03/2016   Procedures:  Procedure(s) (LRB): LEFT TOTAL HIP ARTHROPLASTY ANTERIOR APPROACH (Left)  Attending Physician:  Dr. Paralee Cancel   Admission Diagnoses:   Left hip primary OA / pain  Discharge Diagnoses:  Principal Problem:   S/P left THA, AA  Past Medical History:  Diagnosis Date  . Arthritis    oa  . Cancer [facility]) 1999   right breast  . Headache    hx of vascular 40 yrs ago  . History of kidney stones 1991  . Pain in both feet    at night only toes are in a tight shoe occ pain in middle of a foot at times    HPI:    Lisa Hood, 76 y.o. female, has a history of pain and functional disability in the left hip(s) due to arthritis and patient has failed non-surgical conservative treatments for greater than 12 weeks to include NSAID's and/or analgesics, use of assistive devices and activity modification.  Onset of symptoms was gradual starting 2+ years ago with gradually worsening course since that time.The patient noted no past surgery on the left hip(s).  Patient currently rates pain in the left hip at 9 out of 10 with activity. Patient has night pain, worsening of pain with activity and weight bearing, trendelenberg gait, pain that interfers with activities of daily living and pain with passive range of motion. Patient has evidence of periarticular osteophytes and joint space narrowing by imaging studies. This condition presents safety issues increasing the risk of falls. There is no current active infection.  Risks, benefits and expectations were discussed with the patient.  Risks including but not limited to the risk of anesthesia, blood clots, nerve damage, blood vessel damage, failure of the prosthesis, infection and up to and including death.  Patient understand the risks, benefits and expectations and wishes to proceed with  surgery.   PCP: Wonda Cerise, MD   Discharged Condition: good  Hospital Course:  Patient underwent the above stated procedure on 08/01/2016. Patient tolerated the procedure well and brought to the recovery room in good condition and subsequently to the floor.  POD #1 BP: 94/57 ; Pulse: 57 ; Temp: 97.7 F (36.5 C) ; Resp: 16 Patient reports pain as mild, pain controlled. No events throughout the night.  Dr. Alvan Dame discussed with the patient regarding the severity of her OA. Dorsiflexion/plantar flexion intact, incision: dressing C/D/I, no cellulitis present and compartment soft.   LABS  Basename    HGB     11.0  HCT     32.7   POD #2  BP: 99/52 ; Pulse: 60 ; Temp: 97.9 F (36.6 C) ; Resp: 16 Patient reports pain as mild, pain well controlled. Incident yesterday with tightness across chest and a feeling as the throat was tight.  Rapid response was called and a few medications were administered.  Patient stated that she is feeling much better since that incident.  No issues since.  She has absolutely love the facility and care she has received here.  Labs pending, they were late processing today.   Patient feels that they are ready to be discharged home. Take with Pepcid to help prevent GI upset / ulcers.   LABS   No new labs - at the time of the note   Discharge Exam: General appearance: alert, cooperative and no distress Extremities: Homans sign  is negative, no sign of DVT, no edema, redness or tenderness in the calves or thighs and no ulcers, gangrene or trophic changes  Disposition: Home with follow up in 2 weeks   Follow-up Information    Mauri Pole, MD. Schedule an appointment as soon as possible for a visit in 2 week(s).   Specialty:  Orthopedic Surgery Contact information: [address] [place] [address] [phone]        [facility] Follow up.   Why:  rolling walker and 3n1 Contact information: [address] [place] [address] [phone]           Discharge Instructions    Call MD / Call 911    Complete by:  As directed    If you experience chest pain or shortness of breath, CALL 911 and be transported to the hospital emergency room.  If you develope a fever above 101 F, pus (white drainage) or increased drainage or redness at the wound, or calf pain, call your surgeon's office.   Change dressing    Complete by:  As directed    Maintain surgical dressing until follow up in the clinic. If the edges start to pull up, may reinforce with tape. If the dressing is no longer working, may remove and cover with gauze and tape, but must keep the area dry and clean.  Call with any questions or concerns.   Constipation Prevention    Complete by:  As directed    Drink plenty of fluids.  Prune juice may be helpful.  You may use a stool softener, such as Colace (over the counter) 100 mg twice a day.  Use MiraLax (over the counter) for constipation as needed.   Diet - low sodium heart healthy    Complete by:  As directed    Discharge instructions    Complete by:  As directed    Maintain surgical dressing until follow up in the clinic. If the edges start to pull up, may reinforce with tape. If the dressing is no longer working, may remove and cover with gauze and tape, but must keep the area dry and clean.  Follow up in 2 weeks at [facility]. Call with any questions or concerns.   Increase activity slowly as tolerated    Complete by:  As directed    Weight bearing as tolerated with assist device (walker, cane, etc) as directed, use it as long as suggested by your surgeon or therapist, typically at least 4-6 weeks.   TED hose    Complete by:  As directed    Use stockings (TED hose) for 2 weeks on both leg(s).  You may remove them at night for sleeping.      Allergies as of 08/03/2016   No Known Allergies     Medication List    TAKE these medications   aspirin 81 MG chewable  tablet Chew 1 tablet (81 mg total) by mouth 2 (two) times daily. Take for 4 weeks.   CALCIUM 600+D HIGH POTENCY PO Take 1 tablet by mouth daily.   D3 HIGH POTENCY 2000 units Caps Generic drug:  Cholecalciferol Take 2,000 Units by mouth daily.   docusate sodium 100 MG capsule Commonly known as:  COLACE Take 1 capsule (100 mg total) by mouth 2 (two) times daily.   ferrous sulfate 325 (65 FE) MG tablet Commonly known as:  FERROUSUL Take 1 tablet (325 mg total) by mouth 3 (three)  times daily with meals.   HYDROcodone-acetaminophen 7.5-325 MG tablet Commonly known as:  NORCO Take 1-2 tablets by mouth every 4 (four) hours as needed for moderate pain or severe pain.   methocarbamol 500 MG tablet Commonly known as:  ROBAXIN Take 1 tablet (500 mg total) by mouth every 6 (six) hours as needed for muscle spasms.   multivitamin with minerals Tabs tablet Take 1 tablet by mouth daily.   polyethylene glycol packet Commonly known as:  MIRALAX / GLYCOLAX Take 17 g by mouth 2 (two) times daily.        Signed: [place] Pugh. Samiyyah Moffa   PA-C  08/06/2016, 1:57 PM

## 2017-12-15 IMAGING — CT CT ANGIO CHEST
2 of 8 series · 19 of 36 positions shown · IV contrast (isovue)
Comparison: None.

CLINICAL DATA: Pleuritic chest pain and hemoptysis today. One week
postop from left total hip replacement. History of right breast
cancer and mastectomy, chemotherapy, and radiation in [REDACTED].

EXAM:
CT ANGIOGRAPHY CHEST WITH CONTRAST
TECHNIQUE: Multidetector CT imaging of the chest was performed using the
standard protocol during bolus administration of intravenous
contrast. Multiplanar CT image reconstructions and MIPs were
obtained to evaluate the vascular anatomy.
CONTRAST:  100 cc Isovue 370

[Series 6: pe thins · axial · 0.66mm/px · z∈[+1003,+1283]mm · 18 of 314 slices shown]
[im 17/314  lung]
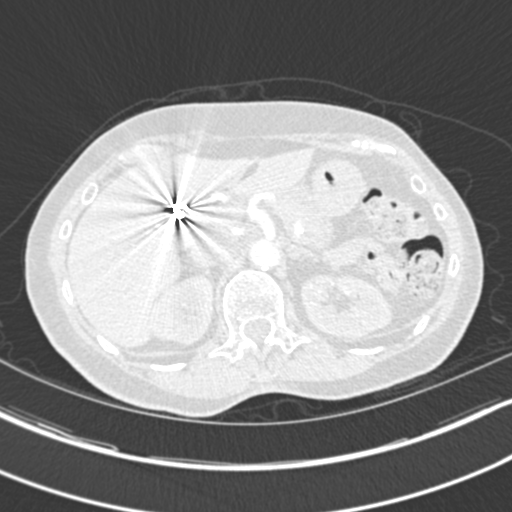
[im 33/314  mediastinal]
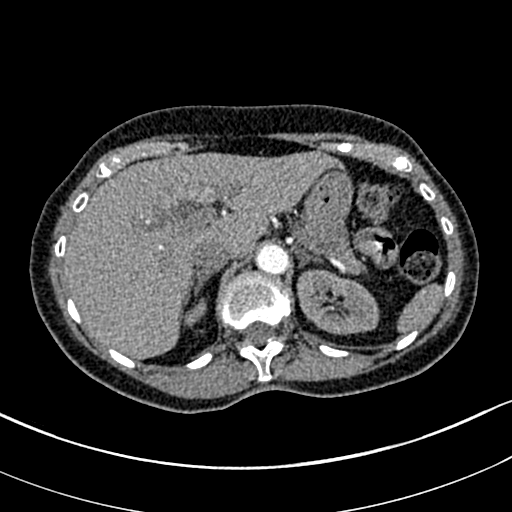
[im 50/314  lung]
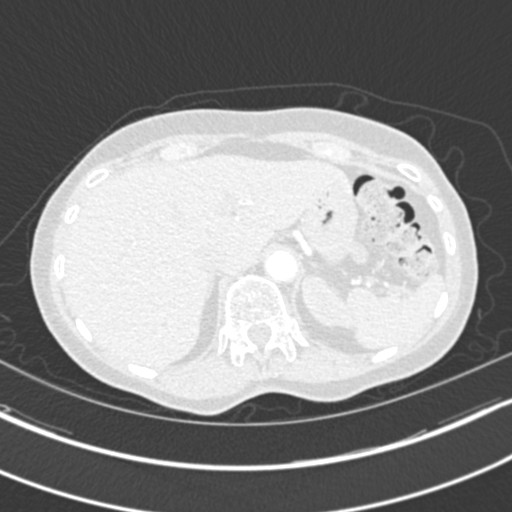
[im 66/314  mediastinal]
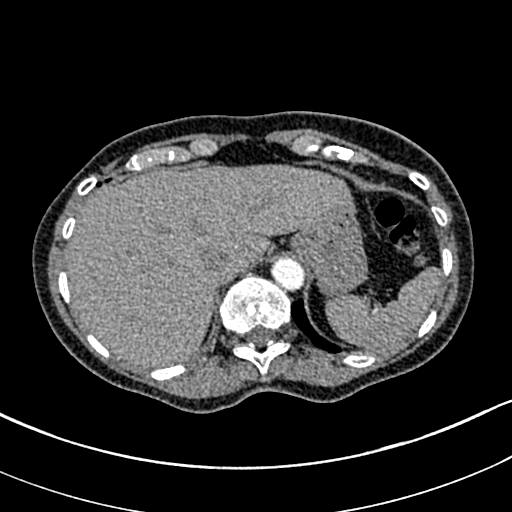
[im 83/314  lung]
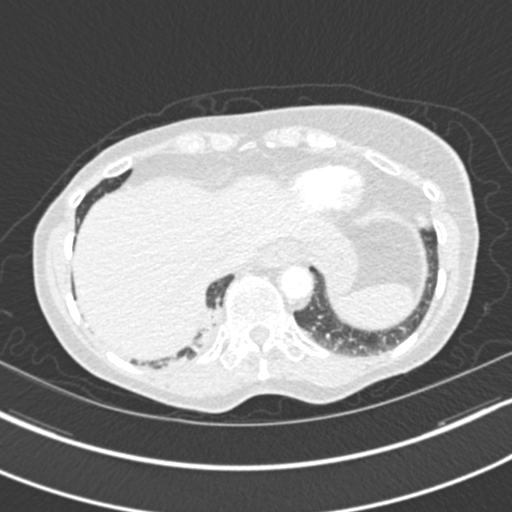
[im 99/314  mediastinal]
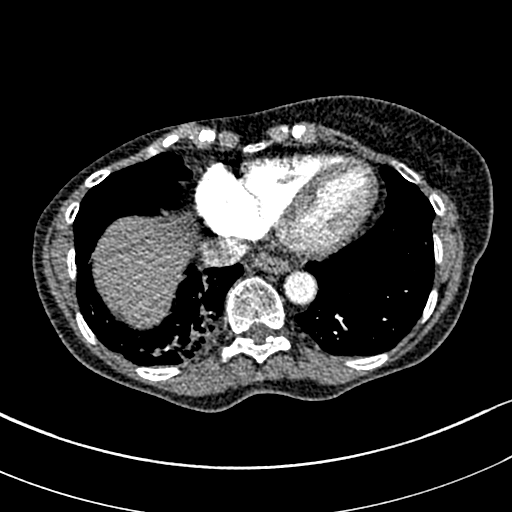
[im 116/314  lung]
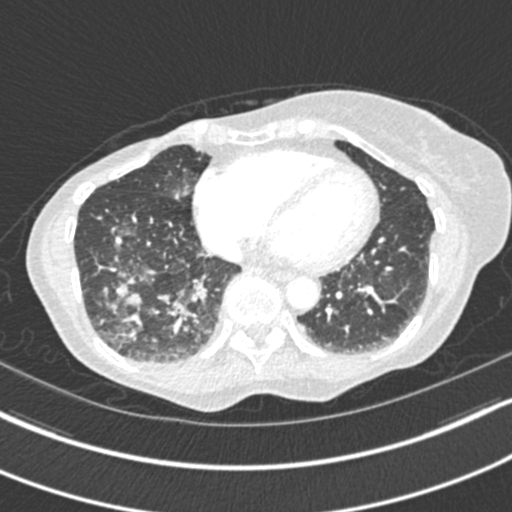
[im 132/314  mediastinal]
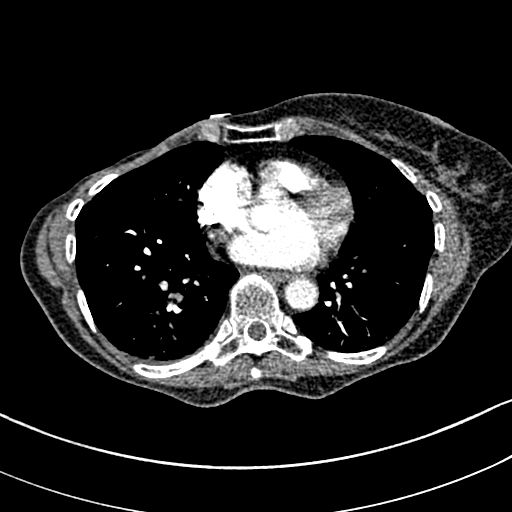
[im 149/314  lung]
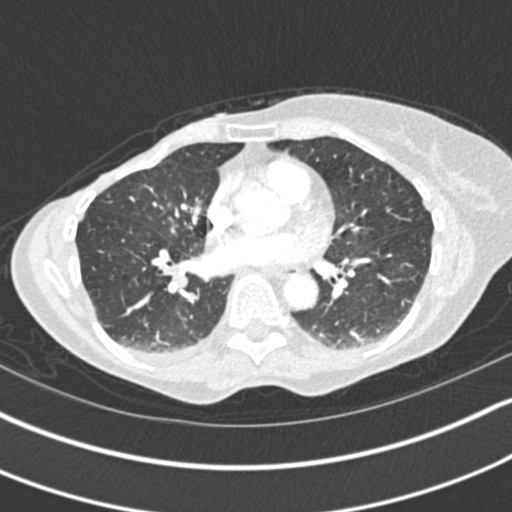
[im 165/314  mediastinal]
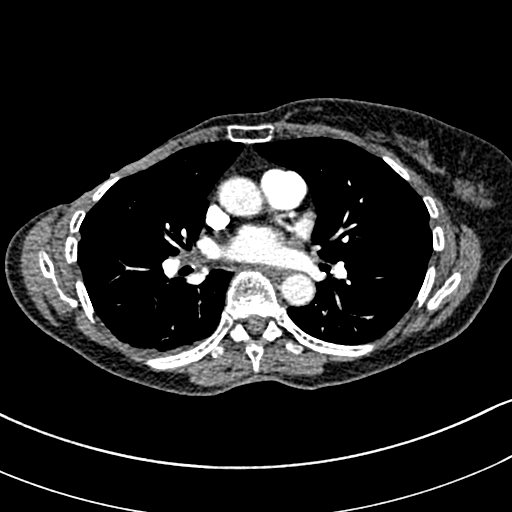
[im 182/314  lung]
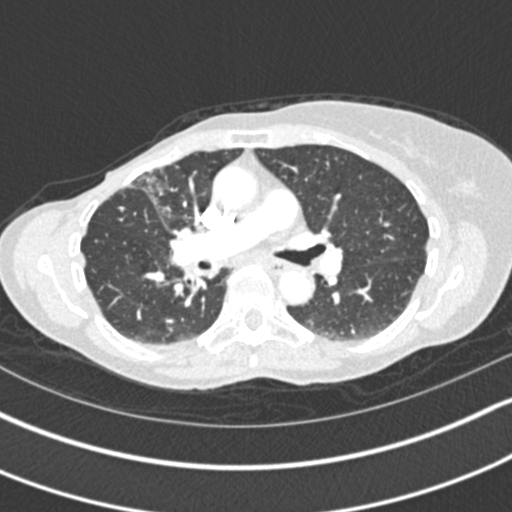
[im 198/314  mediastinal]
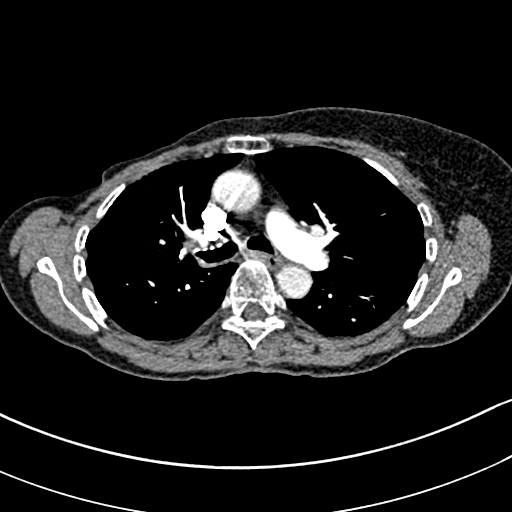
[im 215/314  lung]
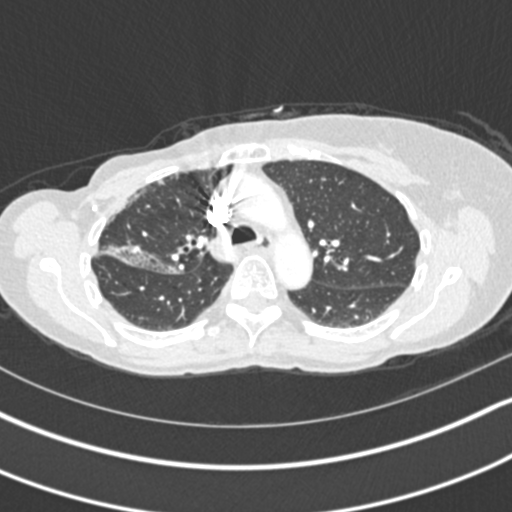
[im 231/314  mediastinal]
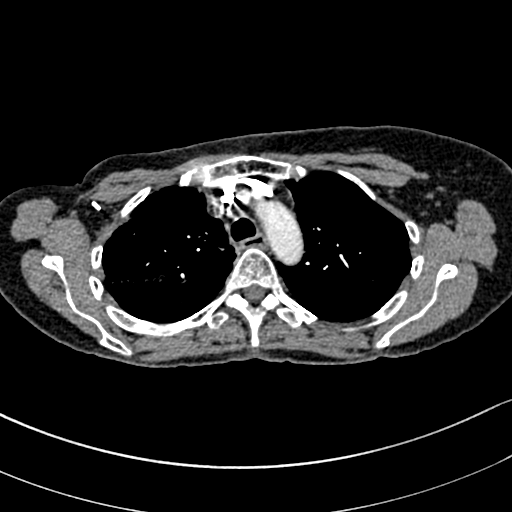
[im 248/314  lung]
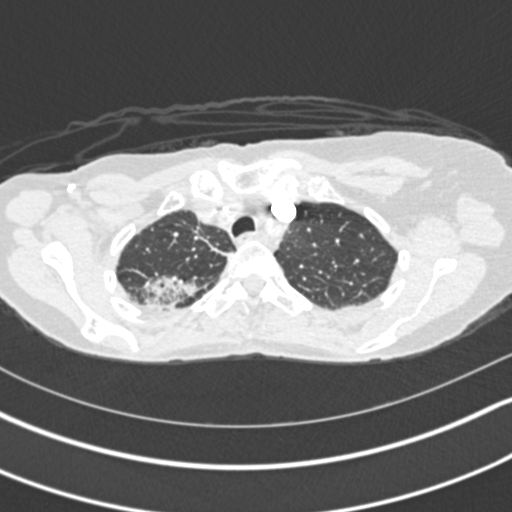
[im 264/314  mediastinal]
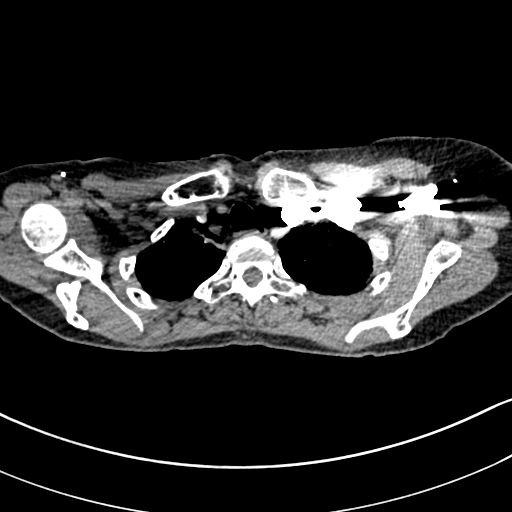
[im 281/314  lung]
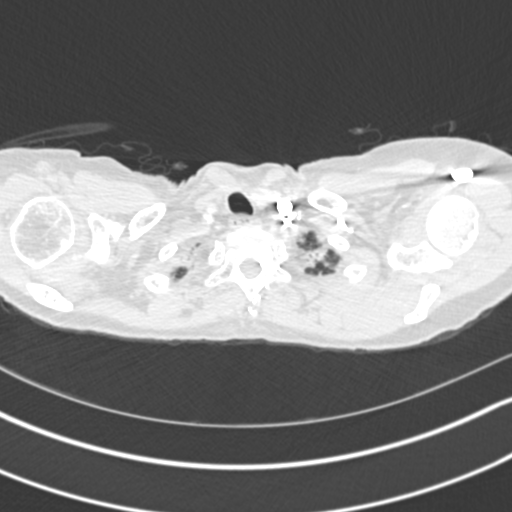
[im 297/314  mediastinal]
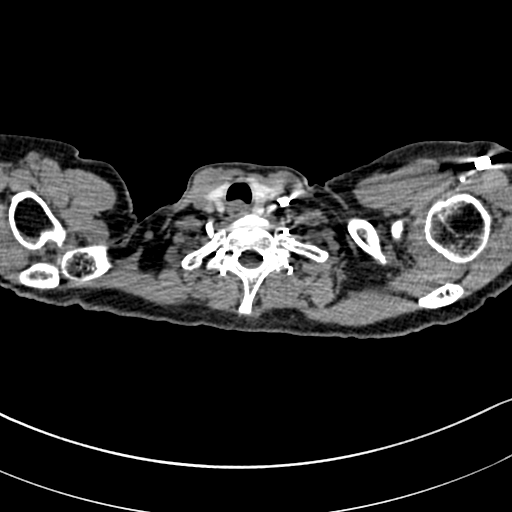

[Series 7: pe coronal mpr · coronal · 0.61mm/px · 1 of 93 slices shown]
[im 47/93  mediastinal]
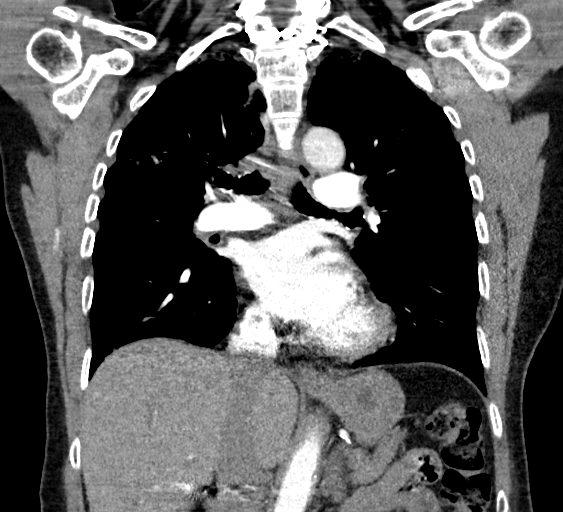

[19 of 36 positions shown; findings below may reference images not displayed]

FINDINGS: Cardiovascular: Coronary artery calcifications are present. Heart
size is normal. The pulmonary arteries are well opacified by
contrast bolus. There is no acute pulmonary embolus.

Mediastinum/Nodes: The visualized portion of the thyroid gland has a
normal appearance. No mediastinal, hilar, or axillary adenopathy.

Lungs/Pleura: There is patchy ground-glass density in a bandlike
density along the posterior aspect of the right upper lobe. Nodular
confluent opacities are identified within the right lower lobe and
right middle lobe consistent with infectious process. No pleural
effusion. The left lung is clear. No suspicious pulmonary nodules.

Upper Abdomen: Surgical clips are noted in the porta hepatis.

Musculoskeletal: No chest wall abnormality. No acute or significant
osseous findings.

Review of the MIP images confirms the above findings.
IMPRESSION: 1. Technically adequate exam.  No acute pulmonary embolus.
2. Right upper lobe, right lower lobe, and right middle lobe
infiltrates, consistent with infectious process.
3. Coronary artery disease.
4. No significant adenopathy.
5. Surgical clips in the porta hepatis. Findings compatible with
prior cholecystectomy.
